# Patient Record
Sex: Female | Born: 1965 | Race: White | Hispanic: No | Smoking: Current every day smoker
Health system: Southern US, Community
[De-identification: ages and names within clinical notes are randomized; demographics above are authoritative.]

## PROBLEM LIST (undated history)

## (undated) DIAGNOSIS — J40 Bronchitis, not specified as acute or chronic: Secondary | ICD-10-CM

## (undated) DIAGNOSIS — N6009 Solitary cyst of unspecified breast: Secondary | ICD-10-CM

## (undated) DIAGNOSIS — J189 Pneumonia, unspecified organism: Secondary | ICD-10-CM

## (undated) DIAGNOSIS — H6690 Otitis media, unspecified, unspecified ear: Secondary | ICD-10-CM

## (undated) DIAGNOSIS — F419 Anxiety disorder, unspecified: Secondary | ICD-10-CM

## (undated) HISTORY — PX: TUBAL LIGATION: SHX77

## (undated) HISTORY — DX: Solitary cyst of unspecified breast: N60.09

---

## 2003-04-27 HISTORY — PX: BREAST SURGERY: SHX581

## 2006-05-03 ENCOUNTER — Encounter (HOSPITAL_COMMUNITY): Admission: RE | Admit: 2006-05-03 | Discharge: 2006-06-02 | Payer: Self-pay | Admitting: Preventative Medicine

## 2007-11-01 ENCOUNTER — Emergency Department (HOSPITAL_COMMUNITY): Admission: EM | Admit: 2007-11-01 | Discharge: 2007-11-02 | Payer: Self-pay | Admitting: Emergency Medicine

## 2009-08-05 ENCOUNTER — Ambulatory Visit (HOSPITAL_COMMUNITY): Admission: RE | Admit: 2009-08-05 | Discharge: 2009-08-05 | Payer: Self-pay | Admitting: Family Medicine

## 2010-03-23 ENCOUNTER — Emergency Department (HOSPITAL_COMMUNITY): Admission: EM | Admit: 2010-03-23 | Discharge: 2010-03-24 | Payer: Self-pay | Admitting: Emergency Medicine

## 2010-11-02 DIAGNOSIS — F17200 Nicotine dependence, unspecified, uncomplicated: Secondary | ICD-10-CM | POA: Insufficient documentation

## 2010-11-02 DIAGNOSIS — J189 Pneumonia, unspecified organism: Secondary | ICD-10-CM | POA: Insufficient documentation

## 2010-11-03 ENCOUNTER — Encounter: Payer: Self-pay | Admitting: *Deleted

## 2010-11-03 ENCOUNTER — Emergency Department (HOSPITAL_COMMUNITY)
Admission: EM | Admit: 2010-11-03 | Discharge: 2010-11-03 | Disposition: A | Discharge status: Home / Self Care | Payer: Self-pay | Attending: Emergency Medicine | Admitting: Emergency Medicine

## 2010-11-03 ENCOUNTER — Emergency Department (HOSPITAL_COMMUNITY): Payer: Self-pay

## 2010-11-03 DIAGNOSIS — J189 Pneumonia, unspecified organism: Secondary | ICD-10-CM

## 2010-11-03 MED ORDER — ONDANSETRON HCL 4 MG/2ML IJ SOLN
4.0000 mg | Freq: Once | INTRAMUSCULAR | Status: AC
  Administered 2010-11-03: 4 mg via INTRAVENOUS

## 2010-11-03 MED ORDER — DOXYCYCLINE HYCLATE 100 MG PO CAPS: 100.0000 mg | ORAL_CAPSULE | Freq: Two times a day (BID) | ORAL | Status: AC

## 2010-11-03 MED ORDER — DOXYCYCLINE HYCLATE 100 MG PO TABS
100.0000 mg | ORAL_TABLET | Freq: Once | ORAL | Status: AC
  Administered 2010-11-03: 100 mg via ORAL
  Filled 2010-11-03: qty 1

## 2010-11-03 MED ORDER — ONDANSETRON HCL 4 MG PO TABS
4.0000 mg | ORAL_TABLET | Freq: Once | ORAL | Status: AC
Start: 2010-11-03 — End: 2010-11-03
  Administered 2010-11-03: 4 mg via ORAL

## 2010-11-03 MED ORDER — HYDROMORPHONE HCL 1 MG/ML IJ SOLN
INTRAMUSCULAR | Status: AC
  Administered 2010-11-03: 04:00:00
  Filled 2010-11-03: qty 1

## 2010-11-03 MED ORDER — SODIUM CHLORIDE 0.9 % IV SOLN
Freq: Once | INTRAVENOUS | Status: AC
  Administered 2010-11-03 (×2): via INTRAVENOUS

## 2010-11-03 MED ORDER — HYDROCODONE-ACETAMINOPHEN 5-325 MG PO TABS: 1.0000 | ORAL_TABLET | ORAL | Status: AC | PRN

## 2010-11-03 MED ORDER — ONDANSETRON HCL 4 MG/2ML IJ SOLN
INTRAMUSCULAR | Status: AC
  Administered 2010-11-03: 05:00:00
  Filled 2010-11-03: qty 2

## 2010-11-03 MED ORDER — ONDANSETRON HCL 4 MG PO TABS
ORAL_TABLET | ORAL | Status: AC
  Administered 2010-11-03: 04:00:00
  Filled 2010-11-03: qty 1

## 2010-11-03 MED ORDER — HYDROMORPHONE HCL 1 MG/ML IJ SOLN
1.0000 mg | Freq: Once | INTRAMUSCULAR | Status: AC
Start: 2010-11-03 — End: 2010-11-03
  Administered 2010-11-03: 1 mg via INTRAVENOUS

## 2010-11-03 MED ORDER — KETOROLAC TROMETHAMINE 30 MG/ML IJ SOLN
INTRAMUSCULAR | Status: AC
  Administered 2010-11-03: 04:00:00
  Filled 2010-11-03: qty 1

## 2010-11-03 MED ORDER — KETOROLAC TROMETHAMINE 30 MG/ML IJ SOLN
30.0000 mg | Freq: Once | INTRAMUSCULAR | Status: AC
  Administered 2010-11-03: 30 mg via INTRAVENOUS

## 2010-11-03 NOTE — ED Notes (Signed)
Pt states HA, productive cough, sinus pressure. Body aches.

## 2010-11-03 NOTE — ED Notes (Signed)
Pt c/o productive cough for a week, now pain in chest and back with coughing,  Denies fevers

## 2010-11-03 NOTE — ED Notes (Signed)
Patient assisted in ambulating around nurse's station. Patient's gait steady, patient does report some dizziness. Dr Colon Branch made aware. Per Dr Colon Branch patient okay to be discharged home.

## 2010-11-03 NOTE — ED Notes (Signed)
Patient is resting comfortably. 

## 2010-11-03 NOTE — ED Provider Notes (Signed)
History     Chief Complaint  Patient presents with  . Cough  . Headache   HPI Comments: Patient has had a cough for over 2 weeks. Now cough is associated with chest discomfort. Sputum is yellow-green. Denies fever, chills.   Patient is a 45 y.o. female presenting with cough and headaches. The history is provided by the patient.  Cough This is a new problem. The current episode started more than 1 week ago. The cough is non-productive. Associated symptoms include chest pain, headaches and shortness of breath. She has tried decongestants for the symptoms. The treatment provided no relief.  Headache  Associated symptoms include shortness of breath.    History reviewed. No pertinent past medical history.  Past Surgical History  Procedure Date  . Cesarean section   . Tubal ligation     History reviewed. No pertinent family history.  History  Substance Use Topics  . Smoking status: Current Everyday Smoker -- 1.0 packs/day  . Smokeless tobacco: Never Used  . Alcohol Use: No    OB History    Grav Para Term Preterm Abortions TAB SAB Ect Mult Living                  Review of Systems  Respiratory: Positive for cough and shortness of breath.   Cardiovascular: Positive for chest pain.  Neurological: Positive for headaches.  All other systems reviewed and are negative.    Physical Exam  BP 84/48  Pulse 65  Temp(Src) 98.3 F (36.8 C) (Oral)  Resp 20  Ht 5\' 9"  (1.753 m)  Wt 136 lb (61.689 kg)  BMI 20.08 kg/m2  SpO2 96%  Physical Exam  Nursing note and vitals reviewed. Constitutional: She is oriented to person, place, and time. She appears well-developed and well-nourished.  HENT:  Head: Normocephalic and atraumatic.  Right Ear: External ear normal.  Left Ear: External ear normal.  Nose: Nose normal.  Mouth/Throat: Oropharynx is clear and moist.  Eyes: Conjunctivae and EOM are normal. Pupils are equal, round, and reactive to light.  Neck: Normal range of motion.  Neck supple.  Cardiovascular: Normal rate, regular rhythm and normal heart sounds.   Pulmonary/Chest: Effort normal and breath sounds normal. No respiratory distress.  Abdominal: Soft. Bowel sounds are normal.  Musculoskeletal: Normal range of motion.  Neurological: She is alert and oriented to person, place, and time.  Skin: Skin is warm and dry.    ED Course  Procedures  MDM       Nicoletta Dress. Colon Branch, MD 11/03/10 [phone]

## 2010-11-18 ENCOUNTER — Emergency Department (HOSPITAL_COMMUNITY)
Admission: EM | Admit: 2010-11-18 | Discharge: 2010-11-18 | Discharge status: Against Medical Advice | Payer: Self-pay | Attending: *Deleted | Admitting: *Deleted

## 2010-11-18 ENCOUNTER — Encounter (HOSPITAL_COMMUNITY): Payer: Self-pay | Admitting: *Deleted

## 2010-11-18 DIAGNOSIS — M549 Dorsalgia, unspecified: Secondary | ICD-10-CM | POA: Insufficient documentation

## 2010-11-18 DIAGNOSIS — Z5320 Procedure and treatment not carried out because of patient's decision for unspecified reasons: Secondary | ICD-10-CM | POA: Insufficient documentation

## 2010-11-18 HISTORY — DX: Pneumonia, unspecified organism: J18.9

## 2010-11-18 LAB — URINALYSIS, ROUTINE W REFLEX MICROSCOPIC
Bilirubin Urine: NEGATIVE
Ketones, ur: NEGATIVE mg/dL
Urobilinogen, UA: 0.2 mg/dL (ref 0.0–1.0)
pH: 5.5 (ref 5.0–8.0)

## 2010-11-18 LAB — URINE MICROSCOPIC-ADD ON

## 2010-11-18 NOTE — ED Notes (Signed)
Patient with recent dx of PNA here, went to [facility] 11/07/10 and started on more antibiotics, pt still c/o lower back pain and now frequent urination, burns slightly with urination

## 2011-01-21 LAB — CBC
HCT: 38.5
MCHC: 33.4
MCV: 90.8
Platelets: 212
RBC: 4.25
RDW: 13.7

## 2011-01-21 LAB — COMPREHENSIVE METABOLIC PANEL
BUN: 17
CO2: 29
Chloride: 108
GFR calc Af Amer: >60
Glucose, Bld: 100 — ABNORMAL HIGH
Potassium: 4.3
Sodium: 140

## 2011-01-21 LAB — DIFFERENTIAL
Basophils Absolute: 0
Eosinophils Relative: 2
Lymphs Abs: 4.3 — ABNORMAL HIGH
Monocytes Relative: 7
Neutro Abs: 6.3

## 2011-01-21 LAB — URINALYSIS, ROUTINE W REFLEX MICROSCOPIC
Protein, ur: NEGATIVE
Urobilinogen, UA: 0.2

## 2011-09-06 ENCOUNTER — Emergency Department (HOSPITAL_COMMUNITY): Payer: Self-pay

## 2011-09-06 ENCOUNTER — Emergency Department (HOSPITAL_COMMUNITY)
Admission: EM | Admit: 2011-09-06 | Discharge: 2011-09-06 | Disposition: A | Discharge status: Home / Self Care | Payer: Self-pay | Attending: Emergency Medicine | Admitting: Emergency Medicine

## 2011-09-06 ENCOUNTER — Encounter (HOSPITAL_COMMUNITY): Payer: Self-pay | Admitting: *Deleted

## 2011-09-06 DIAGNOSIS — R059 Cough, unspecified: Secondary | ICD-10-CM | POA: Insufficient documentation

## 2011-09-06 DIAGNOSIS — M545 Low back pain, unspecified: Secondary | ICD-10-CM | POA: Insufficient documentation

## 2011-09-06 DIAGNOSIS — J069 Acute upper respiratory infection, unspecified: Secondary | ICD-10-CM | POA: Insufficient documentation

## 2011-09-06 DIAGNOSIS — R070 Pain in throat: Secondary | ICD-10-CM | POA: Insufficient documentation

## 2011-09-06 DIAGNOSIS — J329 Chronic sinusitis, unspecified: Secondary | ICD-10-CM | POA: Insufficient documentation

## 2011-09-06 DIAGNOSIS — R05 Cough: Secondary | ICD-10-CM | POA: Insufficient documentation

## 2011-09-06 DIAGNOSIS — IMO0001 Reserved for inherently not codable concepts without codable children: Secondary | ICD-10-CM | POA: Insufficient documentation

## 2011-09-06 DIAGNOSIS — R509 Fever, unspecified: Secondary | ICD-10-CM | POA: Insufficient documentation

## 2011-09-06 DIAGNOSIS — R51 Headache: Secondary | ICD-10-CM | POA: Insufficient documentation

## 2011-09-06 DIAGNOSIS — J3489 Other specified disorders of nose and nasal sinuses: Secondary | ICD-10-CM | POA: Insufficient documentation

## 2011-09-06 MED ORDER — FEXOFENADINE-PSEUDOEPHED ER 60-120 MG PO TB12: 1.0000 | ORAL_TABLET | Freq: Two times a day (BID) | ORAL | Status: DC

## 2011-09-06 MED ORDER — PREDNISONE 10 MG PO TABS: ORAL_TABLET | ORAL | Status: DC

## 2011-09-06 MED ORDER — PROMETHAZINE-CODEINE 6.25-10 MG/5ML PO SYRP: 5.0000 mL | ORAL_SOLUTION | Freq: Four times a day (QID) | ORAL | Status: AC | PRN

## 2011-09-06 NOTE — ED Provider Notes (Signed)
History     CSN: [id number]  Arrival date & time 09/06/11  1152   First MD Initiated Contact with Patient 09/06/11 1308      Chief Complaint  Patient presents with  . Bronchitis    (Consider location/radiation/quality/duration/timing/severity/associated sxs/prior treatment) Patient is a 46 y.o. female presenting with cough. The history is provided by the patient.  Cough This is a recurrent problem. The problem occurs hourly. The problem has been gradually worsening. The cough is productive of sputum. The maximum temperature recorded prior to her arrival was 100 to 100.9 F. Associated symptoms include chills, headaches, rhinorrhea, sore throat and myalgias. Pertinent negatives include no chest pain, no shortness of breath and no wheezing. She has tried cough syrup for the symptoms. The treatment provided mild relief. She is a smoker. Her past medical history is significant for bronchitis and pneumonia.    Past Medical History  Diagnosis Date  . Pneumonia     Past Surgical History  Procedure Date  . Cesarean section   . Tubal ligation     History reviewed. No pertinent family history.  History  Substance Use Topics  . Smoking status: Current Everyday Smoker -- 0.5 packs/day    Types: Cigarettes  . Smokeless tobacco: Never Used  . Alcohol Use: No    OB History    Grav Para Term Preterm Abortions TAB SAB Ect Mult Living                  Review of Systems  Constitutional: Positive for chills. Negative for activity change.       All ROS Neg except as noted in HPI  HENT: Positive for sore throat and rhinorrhea. Negative for nosebleeds and neck pain.   Eyes: Negative for photophobia and discharge.  Respiratory: Positive for cough. Negative for shortness of breath and wheezing.   Cardiovascular: Negative for chest pain and palpitations.  Gastrointestinal: Negative for abdominal pain and blood in stool.  Genitourinary: Negative for dysuria, frequency and hematuria.    Musculoskeletal: Positive for myalgias. Negative for back pain and arthralgias.  Skin: Negative.   Neurological: Positive for headaches. Negative for dizziness, seizures and speech difficulty.  Psychiatric/Behavioral: Negative for hallucinations and confusion.    Allergies  Penicillins  Home Medications   Current Outpatient Rx  Name Route Sig Dispense Refill  . BENZONATATE 100 MG PO CAPS Oral Take 100 mg by mouth 3 (three) times daily as needed.      . CEPHALEXIN 500 MG PO CAPS Oral Take 500 mg by mouth 4 (four) times daily.      Marland Kitchen FEXOFENADINE-PSEUDOEPHED ER 60-120 MG PO TB12 Oral Take 1 tablet by mouth every 12 (twelve) hours. 20 tablet 0  . PREDNISONE 10 MG PO TABS  5,4,3,2,1 - take with food 15 tablet 0  . PROMETHAZINE-CODEINE 6.25-10 MG/5ML PO SYRP Oral Take 5 mLs by mouth every 6 (six) hours as needed for cough. 120 mL 0    BP 109/63  Pulse 83  Temp(Src) 97.4 F (36.3 C) (Oral)  Resp 20  Ht 5\' 7"  (1.702 m)  Wt 134 lb (60.782 kg)  BMI 20.99 kg/m2  SpO2 100%  LMP 08/22/2011  Physical Exam  Nursing note and vitals reviewed. Constitutional: She is oriented to person, place, and time. She appears well-developed and well-nourished.  Non-toxic appearance.  HENT:  Head: Normocephalic.  Right Ear: Tympanic membrane and external ear normal.  Left Ear: Tympanic membrane and external ear normal.       Nasal  congestion present.  Eyes: EOM and lids are normal. Pupils are equal, round, and reactive to light.  Neck: Normal range of motion. Neck supple. Carotid bruit is not present.  Cardiovascular: Normal rate, regular rhythm, normal heart sounds, intact distal pulses and normal pulses.   Pulmonary/Chest: Breath sounds normal. No stridor. No respiratory distress.  Abdominal: Soft. Bowel sounds are normal. There is no tenderness. There is no guarding.  Musculoskeletal: Normal range of motion.       Soreness of the mid and lower back with change of position .  Lymphadenopathy:        Head (right side): No submandibular adenopathy present.       Head (left side): No submandibular adenopathy present.    She has no cervical adenopathy.  Neurological: She is alert and oriented to person, place, and time. She has normal strength. No cranial nerve deficit or sensory deficit.  Skin: Skin is warm and dry.  Psychiatric: She has a normal mood and affect. Her speech is normal.    ED Course  Procedures (including critical care time)  Labs Reviewed - No data to display Dg Chest 2 View  09/06/2011  *RADIOLOGY REPORT*  Clinical Data: Cough, congestion, bronchitis, smoker  CHEST - 2 VIEW  Comparison: 11/03/2010  Findings: Normal heart size, mediastinal contours, and pulmonary vascularity. Lungs appear emphysematous without infiltrate or pleural effusion. No pneumothorax. No acute osseous findings.  IMPRESSION: Emphysematous lungs without acute infiltrate.  Original Report Authenticated By: Lollie Marrow, M.D.     1. Sinusitis   2. URI (upper respiratory infection)       MDM  I have reviewed nursing notes, vital signs, and all appropriate lab and imaging results for this patient. Chest xray negative for pneumonia or acute problem. O2 sat 100%. Rx for allegra D, prednisone, and Promethazine cough med #141ml given to the patient. Pt finished a Z pack last week. Pt to see her primary MD or return  To the ED if any acute changes.       Kathie Dike, [place] 09/06/11 1332

## 2011-09-06 NOTE — ED Notes (Signed)
Pt seen at [facility] on May 5 and dx with bronchitis and prescribed an antibiotic ( z-pack), pt states that she feels worse than she did at the time seen at [facility], states she has been sick since April, states hx of PNA in July 2012

## 2011-09-06 NOTE — Discharge Instructions (Signed)
Sinusitis, Child Sinusitis commonly results from a blockage of the openings that drain your child's sinuses. Sinuses are air pockets within the bones of the face. This blockage prevents the pockets from draining. The multiplication of bacteria within a sinus leads to infection. SYMPTOMS  Pain depends on what area is infected. Infection below your child's eyes causes pain below your child's eyes.  Other symptoms:  Toothaches.   Colored, thick discharge from the nose.   Swelling.   Warmth.   Tenderness.  HOME CARE INSTRUCTIONS  Your child's caregiver has prescribed antibiotics. Give your child the medicine as directed. Give your child the medicine for the entire length of time for which it was prescribed. Continue to give the medicine as prescribed even if your child appears to be doing well. You may also have been given a decongestant. This medication will aid in draining the sinuses. Administer the medicine as directed by your doctor or pharmacist.  Only take over-the-counter or prescription medicines for pain, discomfort, or fever as directed by your caregiver. Should your child develop other problems not relieved by their medications, see yourprimary doctor or visit the Emergency Department. SEEK IMMEDIATE MEDICAL CARE IF:   Your child has an oral temperature above 102 F (38.9 C), not controlled by medicine.   The fever is not gone 48 hours after your child starts taking the antibiotic.   Your child develops increasing pain, a severe headache, a stiff neck, or a toothache.   Your child develops vomiting or drowsiness.   Your child develops unusual swelling over any area of the face or has trouble seeing.   The area around either eye becomes red.   Your child develops double vision, or complains of any problem with vision.  Document Released: 08/22/2006 Document Revised: 04/01/2011 Document Reviewed: 03/28/2007 [facility] Patient Information 2012 [place], [place].Upper Respiratory  Infection, Adult An upper respiratory infection (URI) is also sometimes known as the common cold. The upper respiratory tract includes the nose, sinuses, throat, trachea, and bronchi. Bronchi are the airways leading to the lungs. Most people improve within 1 week, but symptoms can last up to 2 weeks. A residual cough may last even longer.  CAUSES Many different viruses can infect the tissues lining the upper respiratory tract. The tissues become irritated and inflamed and often become very moist. Mucus production is also common. A cold is contagious. You can easily spread the virus to others by oral contact. This includes kissing, sharing a glass, coughing, or sneezing. Touching your mouth or nose and then touching a surface, which is then touched by another person, can also spread the virus. SYMPTOMS  Symptoms typically develop 1 to 3 days after you come in contact with a cold virus. Symptoms vary from person to person. They may include:  Runny nose.   Sneezing.   Nasal congestion.   Sinus irritation.   Sore throat.   Loss of voice (laryngitis).   Cough.   Fatigue.   Muscle aches.   Loss of appetite.   Headache.   Low-grade fever.  DIAGNOSIS  You might diagnose your own cold based on familiar symptoms, since most people get a cold 2 to 3 times a year. Your caregiver can confirm this based on your exam. Most importantly, your caregiver can check that your symptoms are not due to another disease such as strep throat, sinusitis, pneumonia, asthma, or epiglottitis. Blood tests, throat tests, and X-rays are not necessary to diagnose a common cold, but they may sometimes be helpful  in excluding other more serious diseases. Your caregiver will decide if any further tests are required. RISKS AND COMPLICATIONS  You may be at risk for a more severe case of the common cold if you smoke cigarettes, have chronic heart disease (such as heart failure) or lung disease (such as asthma), or if you  have a weakened immune system. The very young and very old are also at risk for more serious infections. Bacterial sinusitis, middle ear infections, and bacterial pneumonia can complicate the common cold. The common cold can worsen asthma and chronic obstructive pulmonary disease (COPD). Sometimes, these complications can require emergency medical care and may be life-threatening. PREVENTION  The best way to protect against getting a cold is to practice good hygiene. Avoid oral or hand contact with people with cold symptoms. Wash your hands often if contact occurs. There is no clear evidence that vitamin C, vitamin E, echinacea, or exercise reduces the chance of developing a cold. However, it is always recommended to get plenty of rest and practice good nutrition. TREATMENT  Treatment is directed at relieving symptoms. There is no cure. Antibiotics are not effective, because the infection is caused by a virus, not by bacteria. Treatment may include:  Increased fluid intake. Sports drinks offer valuable electrolytes, sugars, and fluids.   Breathing heated mist or steam (vaporizer or shower).   Eating chicken soup or other clear broths, and maintaining good nutrition.   Getting plenty of rest.   Using gargles or lozenges for comfort.   Controlling fevers with ibuprofen or acetaminophen as directed by your caregiver.   Increasing usage of your inhaler if you have asthma.  Zinc gel and zinc lozenges, taken in the first 24 hours of the common cold, can shorten the duration and lessen the severity of symptoms. Pain medicines may help with fever, muscle aches, and throat pain. A variety of non-prescription medicines are available to treat congestion and runny nose. Your caregiver can make recommendations and may suggest nasal or lung inhalers for other symptoms.  HOME CARE INSTRUCTIONS   Only take over-the-counter or prescription medicines for pain, discomfort, or fever as directed by your caregiver.     Use a warm mist humidifier or inhale steam from a shower to increase air moisture. This may keep secretions moist and make it easier to breathe.   Drink enough water and fluids to keep your urine clear or pale yellow.   Rest as needed.   Return to work when your temperature has returned to normal or as your caregiver advises. You may need to stay home longer to avoid infecting others. You can also use a face mask and careful hand washing to prevent spread of the virus.  SEEK MEDICAL CARE IF:   After the first few days, you feel you are getting worse rather than better.   You need your caregiver's advice about medicines to control symptoms.   You develop chills, worsening shortness of breath, or brown or red sputum. These may be signs of pneumonia.   You develop yellow or brown nasal discharge or pain in the face, especially when you bend forward. These may be signs of sinusitis.   You develop a fever, swollen neck glands, pain with swallowing, or white areas in the back of your throat. These may be signs of strep throat.  SEEK IMMEDIATE MEDICAL CARE IF:   You have a fever.   You develop severe or persistent headache, ear pain, sinus pain, or chest pain.   You  develop wheezing, a prolonged cough, cough up blood, or have a change in your usual mucus (if you have chronic lung disease).   You develop sore muscles or a stiff neck.  Document Released: 10/06/2000 Document Revised: 04/01/2011 Document Reviewed: 08/14/2010 [facility] Patient Information 2012 [place], [place].

## 2011-09-06 NOTE — ED Notes (Signed)
Cough, headache, no fever,  Seen at [facility] and treated with Z pack  And Hydrocodne syrup, Feels no better

## 2011-09-07 NOTE — ED Provider Notes (Signed)
Medical screening examination/treatment/procedure(s) were performed by non-physician practitioner and as supervising physician I was immediately available for consultation/collaboration.   Yassmine Tamm M Tiegan Terpstra, DO 09/07/11 0711 

## 2012-05-22 ENCOUNTER — Emergency Department (HOSPITAL_COMMUNITY)
Admission: EM | Admit: 2012-05-22 | Discharge: 2012-05-23 | Disposition: A | Discharge status: Home / Self Care | Payer: Self-pay | Attending: Emergency Medicine | Admitting: Emergency Medicine

## 2012-05-22 ENCOUNTER — Emergency Department (HOSPITAL_COMMUNITY): Payer: Self-pay

## 2012-05-22 ENCOUNTER — Encounter (HOSPITAL_COMMUNITY): Payer: Self-pay | Admitting: *Deleted

## 2012-05-22 DIAGNOSIS — B349 Viral infection, unspecified: Secondary | ICD-10-CM

## 2012-05-22 DIAGNOSIS — B9789 Other viral agents as the cause of diseases classified elsewhere: Secondary | ICD-10-CM | POA: Insufficient documentation

## 2012-05-22 DIAGNOSIS — Z8701 Personal history of pneumonia (recurrent): Secondary | ICD-10-CM | POA: Insufficient documentation

## 2012-05-22 DIAGNOSIS — F17200 Nicotine dependence, unspecified, uncomplicated: Secondary | ICD-10-CM | POA: Insufficient documentation

## 2012-05-22 MED ORDER — HYDROCOD POLST-CHLORPHEN POLST 10-8 MG/5ML PO LQCR
5.0000 mL | Freq: Once | ORAL | Status: AC
  Administered 2012-05-22: 5 mL via ORAL
  Filled 2012-05-22: qty 5

## 2012-05-22 MED ORDER — KETOROLAC TROMETHAMINE 60 MG/2ML IM SOLN
60.0000 mg | Freq: Once | INTRAMUSCULAR | Status: AC
  Administered 2012-05-22: 60 mg via INTRAMUSCULAR
  Filled 2012-05-22: qty 2

## 2012-05-22 NOTE — ED Notes (Signed)
Pt reporting generalized body aches, fatigue, chills and cough for 2 days.  Reports symptoms worse today. Denies nausea or vomiting.

## 2012-05-22 NOTE — ED Notes (Signed)
Iva Lento, PA-C in to assess.

## 2012-05-22 NOTE — ED Provider Notes (Signed)
History     CSN: [id number]  Arrival date & time 05/22/12  2159   First MD Initiated Contact with Patient 05/22/12 2248      No chief complaint on file.   (Consider location/radiation/quality/duration/timing/severity/associated sxs/prior treatment) Patient is a 47 y.o. female presenting with URI. The history is provided by the patient.  URI The primary symptoms include fatigue, headaches, sore throat, cough and myalgias. Primary symptoms do not include fever, ear pain, swollen glands, wheezing, abdominal pain, nausea, vomiting, arthralgias or rash. Primary symptoms comment: chills The current episode started 2 days ago. This is a new problem. The problem has been gradually worsening.  The fatigue began 2 days ago. The fatigue has been unchanged since its onset. The fatigue is worsened by nothing.  The headache began 2 days ago. The headache developed gradually. The headache is present intermittently. The headache is not associated with aura, photophobia, double vision, eye pain, decreased vision, stiff neck, neck stiffness, paresthesias, weakness or loss of balance.  The sore throat began 2 days ago. The sore throat has been unchanged since its onset. The sore throat is mild in intensity. The sore throat is not accompanied by trouble swallowing or stridor.  The cough began 2 days ago. The cough is new. The cough is non-productive.  Myalgias began 2 days ago. The myalgias have been unchanged since their onset. The myalgias are generalized. The myalgias are aching. The discomfort from the myalgias is moderate. The myalgias are not associated with weakness.  Symptoms associated with the illness include chills, congestion and rhinorrhea. The illness is not associated with facial pain or sinus pressure.    Past Medical History  Diagnosis Date  . Pneumonia     Past Surgical History  Procedure Date  . Cesarean section   . Tubal ligation     History reviewed. No pertinent family  history.  History  Substance Use Topics  . Smoking status: Current Every Day Smoker -- 0.5 packs/day    Types: Cigarettes  . Smokeless tobacco: Never Used  . Alcohol Use: No    OB History    Grav Para Term Preterm Abortions TAB SAB Ect Mult Living                  Review of Systems  Constitutional: Positive for chills and fatigue. Negative for fever and appetite change.  HENT: Positive for congestion, sore throat and rhinorrhea. Negative for ear pain, facial swelling, trouble swallowing, neck pain, neck stiffness and sinus pressure.   Eyes: Negative for double vision, photophobia and pain.  Respiratory: Positive for cough. Negative for chest tightness, shortness of breath, wheezing and stridor.   Cardiovascular: Negative for chest pain and palpitations.  Gastrointestinal: Negative for nausea, vomiting and abdominal pain.  Genitourinary: Negative for frequency, flank pain, vaginal bleeding, vaginal discharge and difficulty urinating.  Musculoskeletal: Positive for myalgias. Negative for arthralgias.  Skin: Negative for rash.  Neurological: Positive for headaches. Negative for dizziness, syncope, weakness, light-headedness, numbness, paresthesias and loss of balance.  Psychiatric/Behavioral: Negative for confusion.  All other systems reviewed and are negative.    Allergies  Penicillins  Home Medications  No current outpatient prescriptions on file.  BP 112/56  Pulse 98  Temp 98.4 F (36.9 C) (Oral)  Resp 18  Ht 5\' 9"  (1.753 m)  Wt 135 lb (61.236 kg)  BMI 19.94 kg/m2  SpO2 100%  LMP 05/02/2012  Physical Exam  Nursing note and vitals reviewed. Constitutional: She is oriented to person, place, and  time. She appears well-developed and well-nourished. No distress.  HENT:  Head: Normocephalic and atraumatic.  Right Ear: Tympanic membrane and ear canal normal.  Left Ear: Tympanic membrane and ear canal normal.  Nose: Nose normal.  Mouth/Throat: Uvula is midline and  mucous membranes are normal. Posterior oropharyngeal erythema present. No oropharyngeal exudate or posterior oropharyngeal edema.  Eyes: Conjunctivae normal and EOM are normal. Pupils are equal, round, and reactive to light.  Neck: Normal range of motion. Neck supple.  Cardiovascular: Normal rate, regular rhythm, normal heart sounds and intact distal pulses.   No murmur heard. Pulmonary/Chest: Effort normal. No respiratory distress. She has no wheezes. She has no rales. She exhibits no tenderness.       Lung sounds are coarse bilaterally.  No rales or wheezing  Abdominal: Soft. She exhibits no distension. There is no tenderness. There is no rebound and no guarding.  Musculoskeletal: Normal range of motion.  Lymphadenopathy:    She has no cervical adenopathy.  Neurological: She is alert and oriented to person, place, and time. She exhibits normal muscle tone. Coordination normal.  Skin: Skin is warm and dry.    ED Course  Procedures (including critical care time)  Labs Reviewed - No data to display No results found.  Dg Chest 2 View  05/23/2012  *RADIOLOGY REPORT*  Clinical Data: Fever, cough and chills.  CHEST - 2 VIEW  Comparison: 02/18/2012 and prior chest radiographs  Findings: The cardiomediastinal silhouette is unremarkable. 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 A nodular opacity overlying the lower left lung probably represents nipple shadow. There is no evidence of focal airspace disease, pulmonary edema, mass, pleural effusion, or pneumothorax. No acute bony abnormalities are identified.  IMPRESSION: COPD/emphysema without evidence of acute cardiopulmonary disease.  Nodular opacity overlying the lower left lung - likely nipple shadow.  Recommend frontal chest x-ray with nipple markers.   Original Report Authenticated By: Harmon Pier, M.D.    Dg Chest Special View  05/23/2012  *RADIOLOGY REPORT*  Clinical Data: Repeat radiograph with nipple markers as per radiologist request.  CHEST SPECIAL  VIEW  Comparison: 05/22/2012  Findings: Hyperinflation.  Mild interstitial coarsening. The previously described nodule projecting over the left lower lobe corresponds to the level of the nipple marker on the repeat radiograph.  Cardiomediastinal contours within normal limits.  No acute osseous finding.  IMPRESSION: Nodular opacity on prior corresponds to the level of the nipple marker on repeat radiograph.   Original Report Authenticated By: Jearld Lesch, M.D.      MDM   Patient is feeling better after IM toradol and po tussionex.  Vitals stable, sx's likely related to viral illness.    Pt agrees to rest, fluids, and close f/u with her PMD if needed  Prescribed: tussionex 60 mL ibuprofen     Benjamyn Hestand L. Yvette Roark, [place] 05/23/12 0140

## 2012-05-23 ENCOUNTER — Emergency Department (HOSPITAL_COMMUNITY): Payer: Self-pay

## 2012-05-23 MED ORDER — HYDROCOD POLST-CHLORPHEN POLST 10-8 MG/5ML PO LQCR: 5.0000 mL | Freq: Two times a day (BID) | ORAL | Status: DC | PRN

## 2012-05-23 MED ORDER — IBUPROFEN 800 MG PO TABS: 800.0000 mg | ORAL_TABLET | Freq: Three times a day (TID) | ORAL | Status: DC

## 2012-05-23 NOTE — ED Provider Notes (Signed)
Medical screening examination/treatment/procedure(s) were performed by non-physician practitioner and as supervising physician I was immediately available for consultation/collaboration.   Shelda Jakes, MD 05/23/12 1154

## 2012-05-23 NOTE — ED Notes (Signed)
Discharge instructions given and reviewed with patient.  Prescriptions given for Tussionex and Ibuprofen; effects and use explained.  Patient verbalized understanding of sedating effects of Tussionex.  Patient ambulatory; discharged home in good condition.

## 2012-05-23 NOTE — ED Notes (Signed)
Patient transported back to radiology for additional chest film.

## 2012-07-27 ENCOUNTER — Other Ambulatory Visit (HOSPITAL_COMMUNITY): Payer: Self-pay | Admitting: Nurse Practitioner

## 2012-07-27 DIAGNOSIS — Z1231 Encounter for screening mammogram for malignant neoplasm of breast: Secondary | ICD-10-CM

## 2012-08-07 ENCOUNTER — Ambulatory Visit (HOSPITAL_COMMUNITY): Payer: Self-pay

## 2012-08-21 ENCOUNTER — Ambulatory Visit (HOSPITAL_COMMUNITY)
Admission: RE | Admit: 2012-08-21 | Discharge: 2012-08-21 | Disposition: A | Discharge status: Home / Self Care | Payer: Self-pay | Source: Ambulatory Visit | Attending: Family Medicine | Admitting: Family Medicine

## 2012-08-21 DIAGNOSIS — Z1231 Encounter for screening mammogram for malignant neoplasm of breast: Secondary | ICD-10-CM

## 2012-08-22 ENCOUNTER — Encounter (HOSPITAL_COMMUNITY): Payer: Self-pay | Admitting: Emergency Medicine

## 2012-08-22 ENCOUNTER — Emergency Department (HOSPITAL_COMMUNITY)
Admission: EM | Admit: 2012-08-22 | Discharge: 2012-08-22 | Disposition: A | Discharge status: Home / Self Care | Payer: Self-pay | Attending: Emergency Medicine | Admitting: Emergency Medicine

## 2012-08-22 DIAGNOSIS — Z8701 Personal history of pneumonia (recurrent): Secondary | ICD-10-CM | POA: Insufficient documentation

## 2012-08-22 DIAGNOSIS — R21 Rash and other nonspecific skin eruption: Secondary | ICD-10-CM | POA: Insufficient documentation

## 2012-08-22 DIAGNOSIS — Z880 Allergy status to penicillin: Secondary | ICD-10-CM | POA: Insufficient documentation

## 2012-08-22 DIAGNOSIS — F17200 Nicotine dependence, unspecified, uncomplicated: Secondary | ICD-10-CM | POA: Insufficient documentation

## 2012-08-22 MED ORDER — PREDNISONE (PAK) 10 MG PO TABS: 10.0000 mg | ORAL_TABLET | Freq: Every day | ORAL | Status: DC

## 2012-08-22 MED ORDER — DIPHENHYDRAMINE HCL 25 MG PO TABS: 25.0000 mg | ORAL_TABLET | Freq: Four times a day (QID) | ORAL | Status: DC

## 2012-08-22 NOTE — ED Notes (Signed)
Pt c/o rash to trunk of body x one day.

## 2012-08-22 NOTE — ED Notes (Signed)
Pt presents with an irritating/itchy rash that began today after using a tanning bed. Rash is noted to truck of body only with red raised areas. No weeping of rash noted.  Pt denies starting new medication. NAD noted. Denies SOB.

## 2012-08-22 NOTE — ED Provider Notes (Signed)
History     CSN: [id number]  Arrival date & time 08/22/12  1945   First MD Initiated Contact with Patient 08/22/12 2003      Chief Complaint  Patient presents with  . Rash    (Consider location/radiation/quality/duration/timing/severity/associated sxs/prior treatment) Patient is a 47 y.o. female presenting with rash. The history is provided by the patient.  Rash Location:  Torso Torso rash location:  Abd LUQ, abd RUQ, abd LLQ, abd RLQ, upper back and lower back Quality: itchiness and redness   Severity:  Moderate Onset quality:  Sudden Duration:  4 hours Timing:  Constant Progression:  Unchanged Chronicity:  New Relieved by:  None tried Worsened by:  Heat Ineffective treatments:  None tried Associated symptoms: no abdominal pain, no fever, no headaches, no nausea, no shortness of breath and not vomiting    KUMARI SCULLEY is a 47 y.o. female who presents to the ED with a rash. The rash started today after she got out of the tanning bed. She states that she used a new bronzing cream prior to tanning but the rash is only on her abdomen and back. She has not had any other exposures to anything new.    Past Medical History  Diagnosis Date  . Pneumonia     Past Surgical History  Procedure Laterality Date  . Cesarean section    . Tubal ligation      History reviewed. No pertinent family history.  History  Substance Use Topics  . Smoking status: Current Every Day Smoker -- 0.50 packs/day    Types: Cigarettes  . Smokeless tobacco: Never Used  . Alcohol Use: No    OB History   Grav Para Term Preterm Abortions TAB SAB Ect Mult Living                  Review of Systems  Constitutional: Negative for fever and chills.  Respiratory: Negative for cough, chest tightness and shortness of breath.   Gastrointestinal: Negative for nausea, vomiting and abdominal pain.  Musculoskeletal: Negative for back pain.  Skin: Positive for rash.  Neurological: Negative for dizziness  and headaches.  Psychiatric/Behavioral: The patient is not nervous/anxious.     Allergies  Penicillins and Sulfa antibiotics  Home Medications   Current Outpatient Rx  Name  Route  Sig  Dispense  Refill  . chlorpheniramine-HYDROcodone (TUSSIONEX PENNKINETIC ER) 10-8 MG/5ML LQCR   Oral   Take 5 mLs by mouth every 12 (twelve) hours as needed.   60 mL   0   . ibuprofen (ADVIL,MOTRIN) 800 MG tablet   Oral   Take 1 tablet (800 mg total) by mouth 3 (three) times daily. Take with food   21 tablet   0     BP 116/65  Pulse 90  Temp(Src) 97.1 F (36.2 C) (Oral)  Resp 17  Ht 5\' 9"  (1.753 m)  Wt 135 lb (61.236 kg)  BMI 19.93 kg/m2  SpO2 97%  LMP 08/21/2012  Physical Exam  Nursing note and vitals reviewed. Constitutional: She is oriented to person, place, and time. She appears well-developed and well-nourished. No distress.  HENT:  Head: Normocephalic and atraumatic.  Eyes: EOM are normal.  Neck: Neck supple.  Cardiovascular: Normal rate, regular rhythm and normal heart sounds.   Pulmonary/Chest: Effort normal and breath sounds normal. She has no wheezes.  Abdominal: Soft. Bowel sounds are normal. There is no tenderness.  Musculoskeletal: Normal range of motion.  Neurological: She is alert and oriented to person, place,  and time. No cranial nerve deficit.  Skin: Skin is warm and dry.  Papular, erythematous rash on abdomen worse than back.   Psychiatric: She has a normal mood and affect. Her behavior is normal. Judgment and thought content normal.    ED Course  Procedures (including critical care time)  Assessment: 47 y.o. female with rash and itching after using tanning bed   Possible allergic reaction to new tanning cream    Plan:  Prednisone, benadryl, follow up with dermatologist prn  MDM  I have reviewed this patient's vital signs, nurses notes and discussed clinical findings and plan of care with the patient. She voices understanding.   Medication List     TAKE these medications       diphenhydrAMINE 25 MG tablet  Commonly known as:  BENADRYL  Take 1 tablet (25 mg total) by mouth every 6 (six) hours.     predniSONE 10 MG tablet  Commonly known as:  STERAPRED UNI-PAK  Take 1 tablet (10 mg total) by mouth daily. Take 6 tablets today then 5, 4, 3, 2, 1      ASK your doctor about these medications       chlorpheniramine-HYDROcodone 10-8 MG/5ML Lqcr  Commonly known as:  TUSSIONEX PENNKINETIC ER  Take 5 mLs by mouth every 12 (twelve) hours as needed.     ibuprofen 800 MG tablet  Commonly known as:  ADVIL,MOTRIN  Take 1 tablet (800 mg total) by mouth 3 (three) times daily. Take with food               Janne Napoleon, NP 08/22/12 2034

## 2012-08-22 NOTE — ED Provider Notes (Signed)
Medical screening examination/treatment/procedure(s) were performed by non-physician practitioner and as supervising physician I was immediately available for consultation/collaboration.   Loren Racer, MD 08/22/12 [phone]

## 2012-08-28 ENCOUNTER — Other Ambulatory Visit: Payer: Self-pay | Admitting: Family Medicine

## 2012-08-28 DIAGNOSIS — R928 Other abnormal and inconclusive findings on diagnostic imaging of breast: Secondary | ICD-10-CM

## 2012-09-13 ENCOUNTER — Ambulatory Visit (HOSPITAL_COMMUNITY): Payer: Self-pay | Attending: Family Medicine

## 2012-09-13 ENCOUNTER — Encounter (HOSPITAL_COMMUNITY): Payer: Self-pay

## 2013-06-24 DIAGNOSIS — H6690 Otitis media, unspecified, unspecified ear: Secondary | ICD-10-CM

## 2013-06-24 DIAGNOSIS — J40 Bronchitis, not specified as acute or chronic: Secondary | ICD-10-CM

## 2013-06-24 HISTORY — DX: Bronchitis, not specified as acute or chronic: J40

## 2013-06-24 HISTORY — DX: Otitis media, unspecified, unspecified ear: H66.90

## 2013-07-11 ENCOUNTER — Emergency Department (HOSPITAL_COMMUNITY)
Admission: EM | Admit: 2013-07-11 | Discharge: 2013-07-11 | Disposition: A | Discharge status: Home / Self Care | Payer: Self-pay | Attending: Emergency Medicine | Admitting: Emergency Medicine

## 2013-07-11 ENCOUNTER — Encounter (HOSPITAL_COMMUNITY): Payer: Self-pay | Admitting: Emergency Medicine

## 2013-07-11 DIAGNOSIS — J069 Acute upper respiratory infection, unspecified: Secondary | ICD-10-CM | POA: Insufficient documentation

## 2013-07-11 DIAGNOSIS — IMO0002 Reserved for concepts with insufficient information to code with codable children: Secondary | ICD-10-CM | POA: Insufficient documentation

## 2013-07-11 DIAGNOSIS — Z880 Allergy status to penicillin: Secondary | ICD-10-CM | POA: Insufficient documentation

## 2013-07-11 DIAGNOSIS — J40 Bronchitis, not specified as acute or chronic: Secondary | ICD-10-CM

## 2013-07-11 DIAGNOSIS — F17200 Nicotine dependence, unspecified, uncomplicated: Secondary | ICD-10-CM | POA: Insufficient documentation

## 2013-07-11 DIAGNOSIS — Z8701 Personal history of pneumonia (recurrent): Secondary | ICD-10-CM | POA: Insufficient documentation

## 2013-07-11 DIAGNOSIS — J209 Acute bronchitis, unspecified: Secondary | ICD-10-CM | POA: Insufficient documentation

## 2013-07-11 DIAGNOSIS — Z791 Long term (current) use of non-steroidal anti-inflammatories (NSAID): Secondary | ICD-10-CM | POA: Insufficient documentation

## 2013-07-11 MED ORDER — AZITHROMYCIN 250 MG PO TABS: ORAL_TABLET | ORAL | Status: DC

## 2013-07-11 MED ORDER — HYDROCOD POLST-CHLORPHEN POLST 10-8 MG/5ML PO LQCR
5.0000 mL | Freq: Once | ORAL | Status: AC
  Administered 2013-07-11: 5 mL via ORAL
  Filled 2013-07-11: qty 5

## 2013-07-11 MED ORDER — AZITHROMYCIN 250 MG PO TABS
500.0000 mg | ORAL_TABLET | Freq: Once | ORAL | Status: AC
  Administered 2013-07-11: 500 mg via ORAL
  Filled 2013-07-11: qty 2

## 2013-07-11 MED ORDER — GUAIFENESIN-CODEINE 100-10 MG/5ML PO SYRP: 10.0000 mL | ORAL_SOLUTION | Freq: Three times a day (TID) | ORAL | Status: DC | PRN

## 2013-07-11 NOTE — Discharge Instructions (Signed)
Bronchitis °Bronchitis is swelling (inflammation) of the air tubes leading to your lungs (bronchi). This causes mucus and a cough. If the swelling gets bad, you may have trouble breathing. °HOME CARE  °· Rest. °· Drink enough fluids to keep your pee (urine) clear or pale yellow (unless you have a condition where you have to watch how much you drink). °· Only take medicine as told by your doctor. If you were given antibiotic medicines, finish them even if you start to feel better. °· Avoid smoke, irritating chemicals, and strong smells. These make the problem worse. Quit smoking if you smoke. This helps your lungs heal faster. °· Use a cool mist humidifier. Change the water in the humidifier every day. You can also sit in the bathroom with hot shower running for 5 10 minutes. Keep the door closed. °· See your health care provider as told. °· Wash your hands often. °GET HELP IF: °Your problems do not get better after 1 week. °GET HELP RIGHT AWAY IF:  °· Your fever gets worse. °· You have chills. °· Your chest hurts. °· Your problems breathing get worse. °· You have blood in your mucus. °· You pass out (faint). °· You feel lightheaded. °· You have a bad headache. °· You throw up (vomit) again and again. °MAKE SURE YOU: °· Understand these instructions. °· Will watch your condition. °· Will get help right away if you are not doing well or get worse. °Document Released: 09/29/2007 Document Revised: 01/31/2013 Document Reviewed: 12/05/2012 °[employer]® Patient Information ©2014 [employer]. ° °

## 2013-07-11 NOTE — ED Notes (Signed)
Patient c/o congestion, cough and generalized body aches.

## 2013-07-13 NOTE — ED Provider Notes (Signed)
CSN: [id number]     Arrival date & time 07/11/13  1825 History   First MD Initiated Contact with Patient 07/11/13 1932     Chief Complaint  Patient presents with  . Nasal Congestion     (Consider location/radiation/quality/duration/timing/severity/associated sxs/prior Treatment) Patient is a 48 y.o. female presenting with URI. The history is provided by the patient.  URI Presenting symptoms: congestion, cough and rhinorrhea   Presenting symptoms: no fatigue, no fever and no sore throat   Congestion:    Location:  Nasal and chest   Interferes with sleep: no     Interferes with eating/drinking: no   Cough:    Cough characteristics:  Non-productive   Severity:  Mild   Onset quality:  Gradual   Duration:  3 days   Timing:  Constant   Progression:  Unchanged   Chronicity:  New Severity:  Moderate Onset quality:  Gradual Timing:  Constant Progression:  Worsening Chronicity:  New Relieved by:  Nothing Worsened by:  Nothing tried Ineffective treatments:  OTC medications Associated symptoms: myalgias, sinus pain and sneezing   Associated symptoms: no arthralgias, no headaches, no neck pain, no swollen glands and no wheezing   Myalgias:    Location:  Generalized   Quality:  Aching Risk factors: not elderly, no diabetes mellitus and no immunosuppression     Past Medical History  Diagnosis Date  . Pneumonia    Past Surgical History  Procedure Laterality Date  . Cesarean section    . Tubal ligation     No family history on file. History  Substance Use Topics  . Smoking status: Current Every Day Smoker -- 0.50 packs/day    Types: Cigarettes  . Smokeless tobacco: Never Used  . Alcohol Use: No   OB History   Grav Para Term Preterm Abortions TAB SAB Ect Mult Living                 Review of Systems  Constitutional: Negative for fever, chills, activity change, appetite change and fatigue.  HENT: Positive for congestion, rhinorrhea, sinus pressure and sneezing. Negative  for facial swelling, sore throat and trouble swallowing.   Eyes: Negative for visual disturbance.  Respiratory: Positive for cough. Negative for shortness of breath, wheezing and stridor.   Gastrointestinal: Negative for nausea and vomiting.  Genitourinary: Negative for dysuria and frequency.  Musculoskeletal: Positive for myalgias. Negative for arthralgias, gait problem, neck pain and neck stiffness.  Skin: Negative.   Neurological: Negative for dizziness, weakness, numbness and headaches.  Hematological: Negative for adenopathy.  Psychiatric/Behavioral: Negative for confusion.  All other systems reviewed and are negative.      Allergies  Penicillins and Sulfa antibiotics  Home Medications   Current Outpatient Rx  Name  Route  Sig  Dispense  Refill  . azithromycin (ZITHROMAX Z-PAK) 250 MG tablet      Take two tablets on day one, then one tab qd days 2-5   6 tablet   0   . chlorpheniramine-HYDROcodone (TUSSIONEX PENNKINETIC ER) 10-8 MG/5ML LQCR   Oral   Take 5 mLs by mouth every 12 (twelve) hours as needed.   60 mL   0   . diphenhydrAMINE (BENADRYL) 25 MG tablet   Oral   Take 1 tablet (25 mg total) by mouth every 6 (six) hours.   20 tablet   0   . guaiFENesin-codeine (ROBITUSSIN AC) 100-10 MG/5ML syrup   Oral   Take 10 mLs by mouth 3 (three) times daily as needed  for cough.   120 mL   0   . ibuprofen (ADVIL,MOTRIN) 800 MG tablet   Oral   Take 1 tablet (800 mg total) by mouth 3 (three) times daily. Take with food   21 tablet   0   . predniSONE (STERAPRED UNI-PAK) 10 MG tablet   Oral   Take 1 tablet (10 mg total) by mouth daily. Take 6 tablets today then 5, 4, 3, 2, 1   21 tablet   0    BP 107/80  Pulse 89  Temp(Src) 98.2 F (36.8 C) (Oral)  Resp 18  Ht 5\' 9"  (1.753 m)  Wt 148 lb (67.132 kg)  BMI 21.85 kg/m2  SpO2 100%  LMP 07/01/2013 Physical Exam  Nursing note and vitals reviewed. Constitutional: She is oriented to person, place, and time. She  appears well-developed and well-nourished. No distress.  HENT:  Head: Normocephalic and atraumatic.  Right Ear: Tympanic membrane and ear canal normal.  Left Ear: Tympanic membrane and ear canal normal.  Nose: Mucosal edema and rhinorrhea present. Right sinus exhibits frontal sinus tenderness. Left sinus exhibits frontal sinus tenderness.  Mouth/Throat: Uvula is midline and mucous membranes are normal. No trismus in the jaw. No uvula swelling. Posterior oropharyngeal erythema present. No oropharyngeal exudate, posterior oropharyngeal edema or tonsillar abscesses.  Eyes: Conjunctivae are normal.  Neck: Normal range of motion and phonation normal. Neck supple. No Brudzinski's sign and no Kernig's sign noted.  Cardiovascular: Normal rate, regular rhythm, normal heart sounds and intact distal pulses.   No murmur heard. Pulmonary/Chest: Effort normal. No respiratory distress. She has no decreased breath sounds. She has no wheezes. She has no rales.  Coarse lung sounds bilaterally w/o wheezing or rales  Abdominal: Soft. She exhibits no distension. There is no tenderness. There is no rebound and no guarding.  Musculoskeletal: Normal range of motion. She exhibits no edema.  Lymphadenopathy:    She has no cervical adenopathy.  Neurological: She is alert and oriented to person, place, and time. She exhibits normal muscle tone. Coordination normal.  Skin: Skin is warm and dry.    ED Course  Procedures (including critical care time) Labs Review Labs Reviewed - No data to display Imaging Review No results found.   EKG Interpretation None      MDM   Final diagnoses:  Bronchitis  URI (upper respiratory infection)    VSS.  Pt is non-toxic appearing.  Agrees to saline nose spray, ibuprofen, z pack and robitussin AC for cough.    The patient appears reasonably screened and/or stabilized for discharge and I doubt any other medical condition or other [facility] requiring further screening, evaluation,  or treatment in the ED at this time prior to discharge.     Giordano Getman L. Trisha Mangleriplett, PA-C 07/13/13 1305

## 2013-07-14 NOTE — ED Provider Notes (Signed)
Medical screening examination/treatment/procedure(s) were performed by non-physician practitioner and as supervising physician I was immediately available for consultation/collaboration.   EKG Interpretation None       Haleigh Desmith, MD 07/14/13 0741 

## 2013-07-16 ENCOUNTER — Emergency Department (HOSPITAL_COMMUNITY): Payer: Self-pay

## 2013-07-16 ENCOUNTER — Encounter (HOSPITAL_COMMUNITY): Payer: Self-pay | Admitting: Emergency Medicine

## 2013-07-16 ENCOUNTER — Emergency Department (HOSPITAL_COMMUNITY)
Admission: EM | Admit: 2013-07-16 | Discharge: 2013-07-16 | Disposition: A | Discharge status: Home / Self Care | Payer: Self-pay | Attending: Emergency Medicine | Admitting: Emergency Medicine

## 2013-07-16 DIAGNOSIS — Z8701 Personal history of pneumonia (recurrent): Secondary | ICD-10-CM | POA: Insufficient documentation

## 2013-07-16 DIAGNOSIS — Z880 Allergy status to penicillin: Secondary | ICD-10-CM | POA: Insufficient documentation

## 2013-07-16 DIAGNOSIS — Z79899 Other long term (current) drug therapy: Secondary | ICD-10-CM | POA: Insufficient documentation

## 2013-07-16 DIAGNOSIS — J329 Chronic sinusitis, unspecified: Secondary | ICD-10-CM | POA: Insufficient documentation

## 2013-07-16 DIAGNOSIS — F17200 Nicotine dependence, unspecified, uncomplicated: Secondary | ICD-10-CM | POA: Insufficient documentation

## 2013-07-16 MED ORDER — PREDNISONE 20 MG PO TABS: 40.0000 mg | ORAL_TABLET | Freq: Every day | ORAL | Status: DC

## 2013-07-16 MED ORDER — FEXOFENADINE-PSEUDOEPHED ER 60-120 MG PO TB12: 1.0000 | ORAL_TABLET | Freq: Two times a day (BID) | ORAL | Status: DC | PRN

## 2013-07-16 MED ORDER — PREDNISONE 20 MG PO TABS
40.0000 mg | ORAL_TABLET | Freq: Once | ORAL | Status: AC
  Administered 2013-07-16: 40 mg via ORAL
  Filled 2013-07-16: qty 2

## 2013-07-16 NOTE — Discharge Instructions (Signed)

## 2013-07-16 NOTE — ED Provider Notes (Signed)
CSN: [id number]     Arrival date & time 07/16/13  1138 History  This chart was scribed for Laura RazorStephen Monaye Blackie, MD by Leone PayorSonum Patel, ED Scribe. This patient was seen in room APA12/[facility]12 and the patient's care was started 1:21 PM.    Chief Complaint  Patient presents with  . Cough      The history is provided by the patient. No language interpreter was used.    HPI Comments: Laura [name] is a 48 y.o. female who presents to the Emergency Department complaining of a continued, unchanged cough productive of green sputum beginning over 1 week ago. She has associated HA, chest soreness, sinus pressure, and chills. She had a sore throat which has resolved. She was seen on 07/11/13 for the same symptoms and was discharged with Z pack, saline nasal spray, ibuprofen, and Robitussin AC. She has been compliant with these medications without relief. She denies sick contacts. She denies fever, SOB, ear pain. She has a history of pneumonia.    Past Medical History  Diagnosis Date  . Pneumonia    Past Surgical History  Procedure Laterality Date  . Cesarean section    . Tubal ligation     History reviewed. No pertinent family history. History  Substance Use Topics  . Smoking status: Current Every Day Smoker -- 0.50 packs/day    Types: Cigarettes  . Smokeless tobacco: Never Used  . Alcohol Use: No   OB History   Grav Para Term Preterm Abortions TAB SAB Ect Mult Living                 Review of Systems  Constitutional: Positive for chills. Negative for fever.  HENT: Positive for sinus pressure. Negative for ear discharge and ear pain.   Respiratory: Positive for cough and chest tightness. Negative for shortness of breath.   Neurological: Positive for headaches.  All other systems reviewed and are negative.      Allergies  Penicillins and Sulfa antibiotics  Home Medications   Current Outpatient Rx  Name  Route  Sig  Dispense  Refill  . Ascorbic Acid (VITAMIN C PO)   Oral   Take 1 tablet  by mouth daily.         . diphenhydrAMINE (BENADRYL) 25 MG tablet   Oral   Take 1 tablet (25 mg total) by mouth every 6 (six) hours.   20 tablet   0   . guaiFENesin-codeine (ROBITUSSIN AC) 100-10 MG/5ML syrup   Oral   Take 10 mLs by mouth 3 (three) times daily as needed for cough.   120 mL   0   . pseudoephedrine (SUDAFED) 30 MG tablet   Oral   Take 30 mg by mouth 2 (two) times daily.         Marland Kitchen. azithromycin (ZITHROMAX Z-PAK) 250 MG tablet      Take two tablets on day one, then one tab qd days 2-5   6 tablet   0    BP 111/68  Pulse 78  Temp(Src) 98.4 F (36.9 C) (Oral)  Resp 12  Ht 5\' 9"  (1.753 m)  Wt 148 lb (67.132 kg)  BMI 21.85 kg/m2  SpO2 100%  LMP 07/01/2013 Physical Exam  Nursing note and vitals reviewed. Constitutional: She is oriented to person, place, and time. She appears well-developed and well-nourished. No distress.  HENT:  Head: Normocephalic and atraumatic.  Mouth/Throat: Oropharynx is clear and moist. No oropharyngeal exudate.  Boggy nasal mucosa. Maxillary sinus tenderness.  Eyes: EOM are normal.  Neck: Normal range of motion. Neck supple.  Cardiovascular: Normal rate, regular rhythm and normal heart sounds.   Pulmonary/Chest: Effort normal and breath sounds normal.  Abdominal: Soft. She exhibits no distension. There is no tenderness.  Musculoskeletal: Normal range of motion.  Lymphadenopathy:    She has no cervical adenopathy.  Neurological: She is alert and oriented to person, place, and time.  Skin: Skin is warm and dry.  Psychiatric: She has a normal mood and affect. Judgment normal.    ED Course  Procedures (including critical care time)  DIAGNOSTIC STUDIES: Oxygen Saturation is 100% on RA, normal by my interpretation.    COORDINATION OF CARE: 1:21 PM Discussed treatment plan with pt at bedside and pt agreed to plan.   Labs Review Labs Reviewed - No data to display Imaging Review Dg Chest 2 View  07/16/2013   CLINICAL DATA:   COUGH  EXAM: CHEST  2 VIEW  COMPARISON:  DG CHEST SPECIAL VIEW dated 05/23/2012  FINDINGS: The heart size and mediastinal contours are within normal limits. Both lungs are clear. The visualized skeletal structures are unremarkable.  IMPRESSION: No active cardiopulmonary disease.   Electronically Signed   By: Salome Holmes M.D.   On: 07/16/2013 13:24     EKG Interpretation None      MDM   Final diagnoses:  Sinusitis    47yF with likely viral sinusitis. Afebrile. Nontoxic. Finished azithromycin w/o change in symptoms. May be some bronchitic component with persistent cough or possibly from PND. Lungs clear. Plan continued symptomatic tx. Return precautions discussed.   I personally preformed the services scribed in my presence. The recorded information has been reviewed is accurate. Laura Razor, MD.   Laura Razor, MD 07/16/13 [phone]

## 2013-07-16 NOTE — ED Notes (Signed)
Pt c/o continued productive cough (green sputum)/congestion with weakness and chills since being seen in ED 3/18 and dx with bronchitis/uri. Pt states she finished po antibiotic today and could not get appointment with pcp until mid-April.

## 2013-07-22 ENCOUNTER — Emergency Department (HOSPITAL_COMMUNITY)
Admission: EM | Admit: 2013-07-22 | Discharge: 2013-07-22 | Disposition: A | Discharge status: Home / Self Care | Payer: Self-pay | Attending: Emergency Medicine | Admitting: Emergency Medicine

## 2013-07-22 ENCOUNTER — Encounter (HOSPITAL_COMMUNITY): Payer: Self-pay | Admitting: Emergency Medicine

## 2013-07-22 DIAGNOSIS — R05 Cough: Secondary | ICD-10-CM | POA: Insufficient documentation

## 2013-07-22 DIAGNOSIS — H6690 Otitis media, unspecified, unspecified ear: Secondary | ICD-10-CM | POA: Insufficient documentation

## 2013-07-22 DIAGNOSIS — R059 Cough, unspecified: Secondary | ICD-10-CM | POA: Insufficient documentation

## 2013-07-22 DIAGNOSIS — H6980 Other specified disorders of Eustachian tube, unspecified ear: Secondary | ICD-10-CM

## 2013-07-22 DIAGNOSIS — H9190 Unspecified hearing loss, unspecified ear: Secondary | ICD-10-CM | POA: Insufficient documentation

## 2013-07-22 DIAGNOSIS — J3489 Other specified disorders of nose and nasal sinuses: Secondary | ICD-10-CM | POA: Insufficient documentation

## 2013-07-22 DIAGNOSIS — H6990 Unspecified Eustachian tube disorder, unspecified ear: Secondary | ICD-10-CM | POA: Insufficient documentation

## 2013-07-22 DIAGNOSIS — Z880 Allergy status to penicillin: Secondary | ICD-10-CM | POA: Insufficient documentation

## 2013-07-22 DIAGNOSIS — F17200 Nicotine dependence, unspecified, uncomplicated: Secondary | ICD-10-CM | POA: Insufficient documentation

## 2013-07-22 DIAGNOSIS — Z79899 Other long term (current) drug therapy: Secondary | ICD-10-CM | POA: Insufficient documentation

## 2013-07-22 DIAGNOSIS — Z8701 Personal history of pneumonia (recurrent): Secondary | ICD-10-CM | POA: Insufficient documentation

## 2013-07-22 MED ORDER — AZITHROMYCIN 250 MG PO TABS: 250.0000 mg | ORAL_TABLET | Freq: Every day | ORAL | Status: DC

## 2013-07-22 MED ORDER — ANTIPYRINE-BENZOCAINE 5.4-1.4 % OT SOLN
3.0000 [drp] | Freq: Once | OTIC | Status: AC
  Administered 2013-07-22: 4 [drp] via OTIC
  Filled 2013-07-22: qty 10

## 2013-07-22 NOTE — ED Notes (Signed)
PT C/O BILATERAL EAR PAIN AND FEELING AS IF THEY ARE "STOPPED UP."

## 2013-07-22 NOTE — Discharge Instructions (Signed)
Otitis Media, Adult Otitis media is redness, soreness, and swelling (inflammation) of the middle ear. Otitis media may be caused by allergies or, most commonly, by infection. Often it occurs as a complication of the common cold. SIGNS AND SYMPTOMS Symptoms of otitis media may include:  Earache.  Fever.  Ringing in your ear.  Headache.  Leakage of fluid from the ear. DIAGNOSIS To diagnose otitis media, your health care provider will examine your ear with an otoscope. This is an instrument that allows your health care provider to see into your ear in order to examine your eardrum. Your health care provider also will ask you questions about your symptoms. TREATMENT  Typically, otitis media resolves on its own within 3 5 days. Your health care provider may prescribe medicine to ease your symptoms of pain. If otitis media does not resolve within 5 days or is recurrent, your health care provider may prescribe antibiotic medicines if he or she suspects that a bacterial infection is the cause. HOME CARE INSTRUCTIONS   Take your medicine as directed until it is gone, even if you feel better after the first few days.  Only take over-the-counter or prescription medicines for pain, discomfort, or fever as directed by your health care provider.  Follow up with your health care provider as directed. SEEK MEDICAL CARE IF:  You have otitis media only in one ear or bleeding from your nose or both.  You notice a lump on your neck.  You are not getting better in 3 5 days.  You feel worse instead of better. SEEK IMMEDIATE MEDICAL CARE IF:   You have pain that is not controlled with medicine.  You have swelling, redness, or pain around your ear or stiffness in your neck.  You notice that part of your face is paralyzed.  You notice that the bone behind your ear (mastoid) is tender when you touch it. MAKE SURE YOU:   Understand these instructions.  Will watch your condition.  Will get help  right away if you are not doing well or get worse. Document Released: 01/16/2004 Document Revised: 01/31/2013 Document Reviewed: 11/07/2012 [facility] Patient Information 2014 [employer], [place].   Continue with your current treatments.  I encourage you to also add a menthol cough drop or a strong peppermint which can help open up your sinus passages.  You may also want to try a Vicks vapor stick which can help with congestion as well.

## 2013-07-22 NOTE — ED Notes (Signed)
Pt seen and evaluated by EDPa for initial assessment. 

## 2013-07-26 NOTE — ED Provider Notes (Signed)
CSN: [id number]     Arrival date & time 07/22/13  1859 History   First MD Initiated Contact with Patient 07/22/13 1916     Chief Complaint  Patient presents with  . Otalgia     (Consider location/radiation/quality/duration/timing/severity/associated sxs/prior Treatment) HPI Comments: Laura [name] is a 48 y.o. Female presenting with a history of an approximate 2 week history uri symptoms which included nasal congestion, with thick nasal discharge, post nasal drip with cough occasionally productive of green sputum, although this symptom is improving, but has now developed worsening bilateral ear pain, left greater than right, and bilateral muffled hearing acuity.  She has had subjective fevers and also reports sinus congestion and pressure.  She denies sob and sore throat, dizziness and chest pain. She completed a course of zithromax one week ago and also used robitussin AC and nasal saline spray with improvement in her symptoms, now taking allegra d, but now with ear symptoms as described.   The history is provided by the patient.    Past Medical History  Diagnosis Date  . Pneumonia    Past Surgical History  Procedure Laterality Date  . Cesarean section    . Tubal ligation     History reviewed. No pertinent family history. History  Substance Use Topics  . Smoking status: Current Every Day Smoker -- 0.50 packs/day    Types: Cigarettes  . Smokeless tobacco: Never Used  . Alcohol Use: No   OB History   Grav Para Term Preterm Abortions TAB SAB Ect Mult Living                 Review of Systems  Constitutional: Positive for fever and chills.  HENT: Positive for congestion, ear pain, hearing loss, rhinorrhea and sinus pressure. Negative for ear discharge, sore throat, trouble swallowing and voice change.   Eyes: Negative for discharge.  Respiratory: Positive for cough. Negative for shortness of breath, wheezing and stridor.   Cardiovascular: Negative for chest pain.   Gastrointestinal: Negative for abdominal pain.  Genitourinary: Negative.       Allergies  Penicillins and Sulfa antibiotics  Home Medications   Current Outpatient Rx  Name  Route  Sig  Dispense  Refill  . Ascorbic Acid (VITAMIN C PO)   Oral   Take 1 tablet by mouth daily.         . fexofenadine-pseudoephedrine (ALLEGRA-D) 60-120 MG per tablet   Oral   Take 1 tablet by mouth 2 (two) times daily as needed (congestion).   20 tablet   0   . azithromycin (ZITHROMAX) 250 MG tablet   Oral   Take 1 tablet (250 mg total) by mouth daily. Take first 2 tablets together, then 1 every day until finished.   6 tablet   0    BP 108/63  Pulse 91  Temp(Src) 97.9 F (36.6 C) (Oral)  Resp 20  Ht 5\' 9"  (1.753 m)  Wt 148 lb (67.132 kg)  BMI 21.85 kg/m2  SpO2 100%  LMP 07/01/2013 Physical Exam  Constitutional: She is oriented to person, place, and time. Vital signs are normal. She appears well-developed and well-nourished.  Non-toxic appearance.  HENT:  Head: Normocephalic and atraumatic.  Right Ear: Ear canal normal. A middle ear effusion is present. No decreased hearing is noted.  Left Ear: Ear canal normal. Tympanic membrane is injected and bulging. A middle ear effusion is present. No decreased hearing is noted.  Nose: Mucosal edema and rhinorrhea present.  Mouth/Throat: Uvula is midline,  oropharynx is clear and moist and mucous membranes are normal. No oropharyngeal exudate, posterior oropharyngeal edema, posterior oropharyngeal erythema or tonsillar abscesses.  Eyes: Conjunctivae are normal.  Cardiovascular: Normal rate and normal heart sounds.   Pulmonary/Chest: Effort normal. No respiratory distress. She has no wheezes. She has no rales.  Abdominal: Soft. There is no tenderness.  Musculoskeletal: Normal range of motion.  Neurological: She is alert and oriented to person, place, and time.  Skin: Skin is warm and dry. No rash noted.  Psychiatric: She has a normal mood and  affect.    ED Course  Procedures (including critical care time) Labs Review Labs Reviewed - No data to display Imaging Review No results found.   EKG Interpretation None      MDM   Final diagnoses:  Otitis media  Eustachian tube dysfunction    Pt with recent uri, now with sx and exam c/w otitis media.  She has completed a round of zithromax, but now with new otitis.  She was given a repeat zithromax dose pack,  As is pcn, sulfa allergic, would not be compliant with clindamycin or clarithromycin secondary to cost. Given auralgan drops for pain relief.  Encouraged ibuprofen.  Encouraged to continue her allegra d.  The patient appears reasonably screened and/or stabilized for discharge and I doubt any other medical condition or other [facility] requiring further screening, evaluation, or treatment in the ED at this time prior to discharge.   Burgess AmorJulie Alanmichael Barmore, PA-C 07/26/13 [id number]

## 2013-07-26 NOTE — ED Provider Notes (Signed)
Medical screening examination/treatment/procedure(s) were performed by non-physician practitioner and as supervising physician I was immediately available for consultation/collaboration.   EKG Interpretation None       Jakhai Fant, MD 07/26/13 1604 

## 2013-12-04 ENCOUNTER — Observation Stay (HOSPITAL_COMMUNITY): Payer: Self-pay

## 2013-12-04 ENCOUNTER — Observation Stay (HOSPITAL_COMMUNITY)
Admission: EM | Admit: 2013-12-04 | Discharge: 2013-12-05 | Disposition: A | Discharge status: Home / Self Care | Payer: Self-pay | Attending: Cardiology | Admitting: Cardiology

## 2013-12-04 ENCOUNTER — Encounter (HOSPITAL_COMMUNITY): Payer: Self-pay | Admitting: Emergency Medicine

## 2013-12-04 DIAGNOSIS — I471 Supraventricular tachycardia: Secondary | ICD-10-CM | POA: Diagnosis present

## 2013-12-04 DIAGNOSIS — I248 Other forms of acute ischemic heart disease: Principal | ICD-10-CM | POA: Insufficient documentation

## 2013-12-04 DIAGNOSIS — Z720 Tobacco use: Secondary | ICD-10-CM

## 2013-12-04 DIAGNOSIS — I472 Ventricular tachycardia, unspecified: Secondary | ICD-10-CM | POA: Insufficient documentation

## 2013-12-04 DIAGNOSIS — Z8249 Family history of ischemic heart disease and other diseases of the circulatory system: Secondary | ICD-10-CM | POA: Insufficient documentation

## 2013-12-04 DIAGNOSIS — I2489 Other forms of acute ischemic heart disease: Principal | ICD-10-CM | POA: Insufficient documentation

## 2013-12-04 DIAGNOSIS — I4729 Other ventricular tachycardia: Secondary | ICD-10-CM | POA: Insufficient documentation

## 2013-12-04 DIAGNOSIS — F17200 Nicotine dependence, unspecified, uncomplicated: Secondary | ICD-10-CM | POA: Insufficient documentation

## 2013-12-04 DIAGNOSIS — R000 Tachycardia, unspecified: Secondary | ICD-10-CM

## 2013-12-04 HISTORY — DX: Bronchitis, not specified as acute or chronic: J40

## 2013-12-04 HISTORY — DX: Otitis media, unspecified, unspecified ear: H66.90

## 2013-12-04 LAB — TROPONIN I
Troponin I: <0.3 ng/mL (ref <0.30)
Troponin I: <0.3 ng/mL (ref <0.30)

## 2013-12-04 LAB — CBC
HCT: 39.9 % (ref 36.0–46.0)
HEMATOCRIT: 41.8 % (ref 36.0–46.0)
HEMOGLOBIN: 13.1 g/dL (ref 12.0–15.0)
Hemoglobin: 14.3 g/dL (ref 12.0–15.0)
MCH: 30.8 pg (ref 26.0–34.0)
MCH: 30.5 pg (ref 26.0–34.0)
MCHC: 34.2 g/dL (ref 30.0–36.0)
MCHC: 32.8 g/dL (ref 30.0–36.0)
MCV: 90.1 fL (ref 78.0–100.0)
MCV: 93 fL (ref 78.0–100.0)
PLATELETS: 237 10*3/uL (ref 150–400)
Platelets: 185 10*3/uL (ref 150–400)
RBC: 4.64 MIL/uL (ref 3.87–5.11)
RBC: 4.29 MIL/uL (ref 3.87–5.11)
RDW: 13.3 % (ref 11.5–15.5)
RDW: 13.6 % (ref 11.5–15.5)
WBC: 13.5 10*3/uL — AB (ref 4.0–10.5)
WBC: 7.5 10*3/uL (ref 4.0–10.5)

## 2013-12-04 LAB — BASIC METABOLIC PANEL
ANION GAP: 13 (ref 5–15)
BUN: 22 mg/dL (ref 6–23)
CHLORIDE: 105 meq/L (ref 96–112)
CO2: 21 mEq/L (ref 19–32)
CREATININE: 0.73 mg/dL (ref 0.50–1.10)
Calcium: 9.5 mg/dL (ref 8.4–10.5)
GFR calc Af Amer: >90 mL/min (ref >90)
GFR calc non Af Amer: >90 mL/min (ref >90)
Glucose, Bld: 123 mg/dL — ABNORMAL HIGH (ref 70–99)
Potassium: 3.5 mEq/L — ABNORMAL LOW (ref 3.7–5.3)
Sodium: 139 mEq/L (ref 137–147)

## 2013-12-04 LAB — TSH: TSH: 2.56 u[IU]/mL (ref 0.350–4.500)

## 2013-12-04 LAB — I-STAT TROPONIN, ED: Troponin i, poc: 1.07 ng/mL (ref 0.00–0.08)

## 2013-12-04 LAB — CREATININE, SERUM: Creatinine, Ser: 0.68 mg/dL (ref 0.50–1.10)

## 2013-12-04 MED ORDER — POTASSIUM CHLORIDE CRYS ER 20 MEQ PO TBCR
40.0000 meq | EXTENDED_RELEASE_TABLET | Freq: Once | ORAL | Status: AC
  Administered 2013-12-04: 40 meq via ORAL
  Filled 2013-12-04: qty 2

## 2013-12-04 MED ORDER — NITROGLYCERIN 0.4 MG SL SUBL: 0.4000 mg | SUBLINGUAL_TABLET | SUBLINGUAL | Status: DC | PRN

## 2013-12-04 MED ORDER — ASPIRIN EC 81 MG PO TBEC
81.0000 mg | DELAYED_RELEASE_TABLET | Freq: Every day | ORAL | Status: DC
  Filled 2013-12-04: qty 1

## 2013-12-04 MED ORDER — ASPIRIN 81 MG PO CHEW
CHEWABLE_TABLET | ORAL | Status: AC
  Filled 2013-12-04: qty 4

## 2013-12-04 MED ORDER — LORAZEPAM 2 MG/ML IJ SOLN
1.0000 mg | Freq: Once | INTRAMUSCULAR | Status: AC
  Administered 2013-12-04: 1 mg via INTRAVENOUS

## 2013-12-04 MED ORDER — ACETAMINOPHEN 325 MG PO TABS: 650.0000 mg | ORAL_TABLET | ORAL | Status: DC | PRN

## 2013-12-04 MED ORDER — DILTIAZEM HCL 30 MG PO TABS
30.0000 mg | ORAL_TABLET | Freq: Four times a day (QID) | ORAL | Status: DC | PRN
  Filled 2013-12-04: qty 1

## 2013-12-04 MED ORDER — ONDANSETRON HCL 4 MG/2ML IJ SOLN: 4.0000 mg | Freq: Four times a day (QID) | INTRAMUSCULAR | Status: DC | PRN

## 2013-12-04 MED ORDER — ASPIRIN 81 MG PO CHEW: 324.0000 mg | CHEWABLE_TABLET | ORAL | Status: AC

## 2013-12-04 MED ORDER — LORAZEPAM 2 MG/ML IJ SOLN
INTRAMUSCULAR | Status: AC
  Filled 2013-12-04: qty 1

## 2013-12-04 MED ORDER — ASPIRIN 300 MG RE SUPP: 300.0000 mg | RECTAL | Status: AC

## 2013-12-04 MED ORDER — HYDROGEN PEROXIDE 3 % EX SOLN
CUTANEOUS | Status: AC
  Filled 2013-12-04: qty 473

## 2013-12-04 MED ORDER — NICOTINE 21 MG/24HR TD PT24
21.0000 mg | MEDICATED_PATCH | Freq: Every day | TRANSDERMAL | Status: DC
  Administered 2013-12-04 – 2013-12-05 (×2): 21 mg via TRANSDERMAL
  Filled 2013-12-04 (×2): qty 1

## 2013-12-04 MED ORDER — HEPARIN SODIUM (PORCINE) 5000 UNIT/ML IJ SOLN
5000.0000 [IU] | Freq: Three times a day (TID) | INTRAMUSCULAR | Status: DC
  Administered 2013-12-04 – 2013-12-05 (×2): 5000 [IU] via SUBCUTANEOUS
  Filled 2013-12-04 (×4): qty 1

## 2013-12-04 MED ORDER — ASPIRIN 81 MG PO CHEW
324.0000 mg | CHEWABLE_TABLET | Freq: Once | ORAL | Status: AC
  Administered 2013-12-04: 324 mg via ORAL

## 2013-12-04 NOTE — ED Provider Notes (Signed)
CSN: [id number]     Arrival date & time 12/04/13  0221 History   First MD Initiated Contact with Patient 12/04/13 0235   Chief Complaint  Patient presents with  . Palpitations     (Consider location/radiation/quality/duration/timing/severity/associated sxs/prior Treatment) HPI This is a 48 year old female who was watching TV about 30-45 minutes prior to arrival. She had the sudden onset of a rapid pounding heart rate. This was associated with shortness of breath and lightheadedness but no actual chest pain. The symptoms made her very anxious. While in route she took one of her husband's nitroglycerin tablets which seemed to lessen its severity and by the time she arrived here her palpitations had resolved. The nitroglycerin did give her headache. She denies diaphoresis or nausea. She was noted to be hypotensive on arrival but states that she has a history of low blood pressures.   Past Medical History  Diagnosis Date  . Pneumonia    Past Surgical History  Procedure Laterality Date  . Cesarean section    . Tubal ligation     Family History  Problem Relation Age of Onset  . CAD Father    History  Substance Use Topics  . Smoking status: Current Every Day Smoker -- 0.50 packs/day    Types: Cigarettes  . Smokeless tobacco: Never Used  . Alcohol Use: No   OB History   Grav Para Term Preterm Abortions TAB SAB Ect Mult Living                 Review of Systems  All other systems reviewed and are negative.   Allergies  Penicillins and Sulfa antibiotics  Home Medications   Prior to Admission medications   Medication Sig Start Date End Date Taking? Authorizing Provider  Ascorbic Acid (VITAMIN C PO) Take 1 tablet by mouth daily.    Historical Provider, MD  azithromycin (ZITHROMAX) 250 MG tablet Take 1 tablet (250 mg total) by mouth daily. Take first 2 tablets together, then 1 every day until finished. 07/22/13   Burgess AmorJulie Idol, PA-C  fexofenadine-pseudoephedrine (ALLEGRA-D) 60-120 MG  per tablet Take 1 tablet by mouth 2 (two) times daily as needed (congestion). 07/16/13   Raeford RazorStephen Kohut, MD   BP 111/74  Pulse 55  Temp(Src) 98.1 F (36.7 C) (Oral)  Resp 22  Ht 5\' 9"  (1.753 m)  Wt 143 lb (64.864 kg)  BMI 21.11 kg/m2  SpO2 100%  LMP 11/20/2013  Physical Exam General: Well-developed, well-nourished female in no acute distress; appearance consistent with age of record HENT: normocephalic; atraumatic Eyes: pupils equal, round and reactive to light; extraocular muscles intact Neck: supple Heart: regular rate and rhythm; no murmur; no ectopy Lungs: clear to auscultation bilaterally Abdomen: soft; nondistended; nontender; bowel sounds present Extremities: No deformity; full range of motion; pulses normal Neurologic: Awake, alert and oriented; motor function intact in all extremities and symmetric; no facial droop Skin: Warm and dry Psychiatric: Anxious    ED Course  Procedures (including critical care time)  CRITICAL CARE Performed by: Doyl Bitting L Total critical care time: 35 minutes Critical care time was exclusive of separately billable procedures and treating other patients. Critical care was necessary to treat or prevent imminent or life-threatening deterioration. Critical care was time spent personally by me on the following activities: development of treatment plan with patient and/or surrogate as well as nursing, discussions with consultants, evaluation of patient's response to treatment, examination of patient, obtaining history from patient or surrogate, ordering and performing treatments and interventions, ordering and  review of laboratory studies, ordering and review of radiographic studies, pulse oximetry and re-evaluation of patient's condition.Marland Kitchenl  MDM   EKG Interpretation:  Date & Time: 12/04/2013 2:52 AM  Rate: 74  Rhythm: normal sinus rhythm  QRS Axis: normal  Intervals: normal  ST/T Wave abnormalities: normal  Conduction Disutrbances:none   Narrative Interpretation: poor R-wave progression  Old EKG Reviewed: none available  EKG Interpretation:  Date & Time: 12/04/2013 7:14 AM  Rate: 69  Rhythm: normal sinus rhythm  QRS Axis: normal  Intervals: normal  ST/T Wave abnormalities: normal  Conduction Disutrbances:none  Narrative Interpretation: poor R-wave progression  Old EKG Reviewed: unchanged   Nursing notes and vitals signs, including pulse oximetry, reviewed.  Summary of this visit's results, reviewed by myself:  Labs:  Results for orders placed during the hospital encounter of 12/04/13 (from the past 24 hour(s))  TROPONIN I     Status: None   Collection Time    12/04/13  2:55 AM      Result Value Ref Range   Troponin I <0.30  <0.30 ng/mL  CBC     Status: Abnormal   Collection Time    12/04/13  2:55 AM      Result Value Ref Range   WBC 13.5 (*) 4.0 - 10.5 K/uL   RBC 4.64  3.87 - 5.11 MIL/uL   Hemoglobin 14.3  12.0 - 15.0 g/dL   HCT 16.1  09.6 - 04.5 %   MCV 90.1  78.0 - 100.0 fL   MCH 30.8  26.0 - 34.0 pg   MCHC 34.2  30.0 - 36.0 g/dL   RDW 40.9  81.1 - 91.4 %   Platelets 237  150 - 400 K/uL  BASIC METABOLIC PANEL     Status: Abnormal   Collection Time    12/04/13  2:55 AM      Result Value Ref Range   Sodium 139  137 - 147 mEq/L   Potassium 3.5 (*) 3.7 - 5.3 mEq/L   Chloride 105  96 - 112 mEq/L   CO2 21  19 - 32 mEq/L   Glucose, Bld 123 (*) 70 - 99 mg/dL   BUN 22  6 - 23 mg/dL   Creatinine, Ser 7.82  0.50 - 1.10 mg/dL   Calcium 9.5  8.4 - 95.6 mg/dL   GFR calc non Af Amer >90  >90 mL/min   GFR calc Af Amer >90  >90 mL/min   Anion gap 13  5 - 15  I-STAT TROPOININ, ED     Status: Abnormal   Collection Time    12/04/13  5:53 AM      Result Value Ref Range   Troponin i, poc 1.07 (*) 0.00 - 0.08 ng/mL   Comment NOTIFIED PHYSICIAN     Comment 3           TROPONIN I     Status: None   Collection Time    12/04/13  6:28 AM      Result Value Ref Range   Troponin I <0.30  <0.30 ng/mL   6:30  AM Second troponin is positive for myocardial damage. Aspirin given. Repeat EKG is unchanged. [facility] consult cardiology for transfer to [facility]. Patient continues to be asymptomatic in the ED.   6:42 AM Dr. Anne Fu accepts for transfer to [facility]. He advises against heparin at this time. Diagnosis: demand ischemia secondary to tachyarrhythmia. He requests a confirmatory troponin in the main lab.     Ellianah Cordy L  Daryl Quiros, MD 12/04/13 1610

## 2013-12-04 NOTE — ED Notes (Signed)
Patient states she took her husband's nitro pill. Denies chest pain at this time

## 2013-12-04 NOTE — Progress Notes (Signed)
Echocardiogram 2D Echocardiogram has been performed.  Mitcheal Sweetin 12/04/2013, 1:07 PM

## 2013-12-04 NOTE — H&P (Signed)
Admit date: 12/04/2013 Primary Physician  [facility] Primary [facility]  None  CC: Pounding heart  HPI: 48 year-old female smoker whose father died at age 48 from myocardial infarction who presented to a knee pain emergency department and was seen by Dr. Read DriversMolpus with complaints of tachycardia.  This started at approximately 1:30 AM this morning. Described as a pounding, fast heart rate. She asked her daughter to come listen to her and her daughter could feel her heart pounding in her chest at a rapid pace. The heart rate seemed to decrease suddenly but she is unsure. She took one of her husbands nitroglycerin to see if this would help. He went to the emergency department for further evaluation.  While in the emergency department, the first troponin was normal, serum. A second troponin, point-of-care was drawn and this was abnormal. A third troponin, serum once again, was drawn and this was normal. There is concern for potential demand ischemia and [facility].  She denies any recent fevers, chills, cough, syncope. She did feel somewhat dizzy with her pounding heart. She denied any chest pain that was significant at that time.  Approximately 2 months ago she had a similar episode that lasted less than an hours duration. Occasionally she will feel palpitations.  Currently she is in sinus rhythm. There was not in EKG or telemetry that demonstrated her possible SVT.  She seems quite tired.    PMH:   Past Medical History  Diagnosis Date  . Pneumonia   . Otitis media 06/2013  . Bronchitis 06/2013    PSH:   Past Surgical History  Procedure Laterality Date  . Cesarean section    . Tubal ligation     Allergies:  Penicillins and Sulfa antibiotics Prior to Admit Meds:   Prior to Admission medications   Not on File   Fam HX:    Family History  Problem Relation Age of Onset  . CAD Father    Social HX:   Denies drug use. Denies  alcohol use. History   Social History  . Marital Status: Single    Spouse Name: N/A    Number of Children: N/A  . Years of Education: N/A   Occupational History  . Not on file.   Social History Main Topics  . Smoking status: Current Every Day Smoker -- 0.50 packs/day    Types: Cigarettes  . Smokeless tobacco: Never Used  . Alcohol Use: No  . Drug Use: No  . Sexual Activity: No   Other Topics Concern  . Not on file   Social History Narrative  . No narrative on file     ROS:  All 11 ROS were addressed and are negative except what is stated in the HPI   Physical Exam: Blood pressure 97/58, pulse 60, temperature 98.2 F (36.8 C), temperature source Oral, resp. rate 18, height 5\' 9"  (1.753 m), weight 143 lb (64.864 kg), last menstrual period 11/20/2013, SpO2 99.00%.   General: Well developed, well nourished, in no acute distress Head: Eyes PERRLA, No xanthomas.   Normal cephalic and atramatic  Lungs:  Clear bilaterally to auscultation and percussion. Normal respiratory effort. No wheezes, no rales. Heart:  HRRR S1 S2 Pulses are 2+ & equal. No murmurs, rubs or gallops.             No carotid bruit. No JVD.  No abdominal bruits. Abdomen: Bowel sounds are positive, abdomen soft and non-tender without masses  or                 Hernia's noted. No hepatosplenomegaly. Msk:  Back normal, normal gait. Normal strength and tone for age. Extremities:  No clubbing, cyanosis or edema.  DP +1 Neuro: Alert and oriented X 3, non-focal, MAE x 4 GU: Deferred Rectal: Deferred Psych:  Good affect, responds appropriately         Labs:   Lab Results  Component Value Date   WBC 13.5* 12/04/2013   HGB 14.3 12/04/2013   HCT 41.8 12/04/2013   MCV 90.1 12/04/2013   PLT 237 12/04/2013    Recent Labs Lab 12/04/13 0255  NA 139  K 3.5*  CL 105  CO2 21  BUN 22  CREATININE 0.73  CALCIUM 9.5  GLUCOSE 123*    Recent Labs  12/04/13 0255 12/04/13 0628  TROPONINI <0.30 <0.30    EKG:   Sinus rhythm with no other abnormalities Personally viewed.  ASSESSMENT/PLAN:   48 year old female smoker with early family history of CAD with possible PSVT.  1. Tachycardia/PSVT/palpitations-continue on telemetry. Point-of-care troponin was abnormally elevated but repeat serum troponin was normal. There did not seem to be any confirming evidence of demand ischemia. During a prior episode she may have felt a sensation in her chest during her tachycardia. I will check an echocardiogram. Continue to observe on telemetry. Her blood pressure is fairly low and because of this, I would advocate as needed short-acting diltiazem if her tachycardia returns. I also taught her Valsalva maneuver to hopefully help terminate if this is in fact a reentrant tachycardia.  Ultimately, if we do not catch on telemetry over the next 24 hours, I would advocate an event monitor. If PSVT is discovered, electrophysiology referral would be helpful for ablation given her low normal blood pressure.  I will check a TSH. I will also check a chest x-ray. White count mildly elevated but no fevers.  At this point since she's not having any chest discomfort, serum troponins are normal, I will forego stress test. If she demonstrates any signs of angina with activity, consider stress test.  2. Tobacco use-strongly encourage cessation.  3. Early family history of CAD-father myocardial infarction 27.  4. Leukocytosis-I will check a chest x-ray. History of smoking.  5. Mild hypokalemia-I will replete  Donato Schultz, MD  12/04/2013  10:29 AM

## 2013-12-04 NOTE — ED Notes (Signed)
Pt reports feeling heart racing.  States symptoms began about 45 min ago.  Reports taking 1 SL nitro.  Denies nausea or vomiting at this time.  Reporting feeling some tingling in left arm.

## 2013-12-05 ENCOUNTER — Other Ambulatory Visit: Payer: Self-pay | Admitting: Physician Assistant

## 2013-12-05 ENCOUNTER — Telehealth: Payer: Self-pay | Admitting: Physician Assistant

## 2013-12-05 DIAGNOSIS — R002 Palpitations: Secondary | ICD-10-CM

## 2013-12-05 LAB — CBC
HCT: 40.5 % (ref 36.0–46.0)
HEMOGLOBIN: 13.2 g/dL (ref 12.0–15.0)
MCH: 30.1 pg (ref 26.0–34.0)
MCHC: 32.6 g/dL (ref 30.0–36.0)
MCV: 92.5 fL (ref 78.0–100.0)
Platelets: 223 10*3/uL (ref 150–400)
RBC: 4.38 MIL/uL (ref 3.87–5.11)
RDW: 13.5 % (ref 11.5–15.5)
WBC: 9.1 10*3/uL (ref 4.0–10.5)

## 2013-12-05 LAB — LIPID PANEL
Cholesterol: 122 mg/dL (ref 0–200)
HDL: 40 mg/dL (ref >39)
LDL Cholesterol: 60 mg/dL (ref 0–99)
TRIGLYCERIDES: 109 mg/dL (ref <150)
Total CHOL/HDL Ratio: 3.1 RATIO
VLDL: 22 mg/dL (ref 0–40)

## 2013-12-05 MED ORDER — DILTIAZEM HCL 30 MG PO TABS: 30.0000 mg | ORAL_TABLET | Freq: Four times a day (QID) | ORAL | Status: DC | PRN

## 2013-12-05 NOTE — Telephone Encounter (Signed)
Patient tried to pick up the 30 days event monitor [facility], however since she had no insurance, the office staff who handles insurance if out of the office until next Monday 8/17. I have contacted our [place] office to see if they have 30 day event monitor as patient actually live in [place]. I have asked our [place] office to see if they can assist the patient in the mean time given her financial problem. She may be better suited follow up either with our [place] office or [place] office depend on her preference.  Ramond DialSigned, Elwood Bazinet PA Pager: [phone]

## 2013-12-05 NOTE — Telephone Encounter (Signed)
[facility]. If she wishes to followup in [place] this is fine with me.  Donato SchultzSKAINS, MARK, MD

## 2013-12-05 NOTE — Discharge Summary (Signed)
Discharge Summary   Patient ID: Laura [name],  MRN: [id number], DOB/AGE: 48/20/1967 48 y.o.  Admit date: 12/04/2013 Discharge date: 12/05/2013  Primary Care Provider: [facility] Primary Cardiologist: New - Dr. Anne Fu  Discharge Diagnoses Principal Problem:   PSVT (paroxysmal supraventricular tachycardia) Active Problems:   Family history of ischemic heart disease   Tobacco use   Allergies Allergies  Allergen Reactions  . Penicillins Shortness Of Breath  . Sulfa Antibiotics Other (See Comments)    blisters    Procedures  LV EF: 60% - 65%  ------------------------------------------------------------------- Indications: Supraventricular tachycardia 427.0.  ------------------------------------------------------------------- History: Risk factors: Family history of coronary artery disease. Current tobacco use.  ------------------------------------------------------------------- Study Conclusions  - Left ventricle: The cavity size was normal. Wall thickness was normal. Systolic function was normal. The estimated ejection fraction was in the range of 60% to 65%. Wall motion was normal; there were no regional wall motion abnormalities. Left ventricular diastolic function parameters were normal. - Aortic valve: Poorly visualized. Probably trileaflet; mildly calcified leaflets. - Mitral valve: There was no regurgitation. - Right ventricle: The cavity size was normal. Systolic function was normal. - Pulmonary arteries: No complete TR doppler jet so unable to estimate PA systolic pressure. - Inferior vena cava: The vessel was normal in size. The respirophasic diameter changes were in the normal range (>= 50%), consistent with normal central venous pressure.  Impressions:  - Normal LV size with EF 60-65%. Normal diastolic function. Normal RV size and systolic function. No significant valvular abnormalities noted.   Hospital Course  The patient  is a 48 year old Caucasian female with past medical history significant for tobacco abuse and significant family history of early heart attack in both her mother and her paternal grandmother. Patient has been having intermittent palpitation occurring once every 2 weeks. She presented to [facility] ED on 12/04/2013 after waking up with significant palpitation and a fast heart rate. She asked her daughter to listen to her chest and it was noted that her heart rate was very fast however her heart rate seems to decreased suddenly. She took her husband's nitroglycerin prior to ED visit with some improvement. According to the patient, she spent 2 hours detailing her car on the day prior without significant chest discomfort or shortness breath. She denies any recent fever, chill, cough, or syncope. However she does drink a lot of [employer] and caffeinated drinks. Her symptom is most consistent with either SVT or proxysmal atrial tachycardia. The first point-of-care troponin in the ED was elevated at 1.07, however troponin I obtained subsequently was negative. Serial troponin was negative as well. TSH was normal. Echocardiogram was obtained on 12/04/2013 which showed EF 60-65%, with normal wall motion, normal diastolic function. Patient was able to ambulate in the hallway without significant chest discomfort. Overnight telemetry did show intermittent sinus tachycardia with heart rate in the 120s, however she was asymptomatic with those heart rate and majority of her heart rate has been stable in the 60s.  Given her current lack of symptom, she is deemed stable for discharge. We will set up a 30 day event monitor after discharge. We were unable to give her any scheduled medication for rate control during admission because her relative hypotension with systolic blood pressure remained in the high 90s. We will discharge her with PRN 30 mg Short-acting diltiazem for symptom onset. Given her significant family history of  early coronary disease, it is not inappropriate to obtain outpatient stress test later if her symptom increase  in frequency. Patient was seen the morning of 12/05/2013, Dr. Elease HashimotoNahser has explained to the patient valsalva maneuver and diving reflex to potentially stop any tachycardia assuming  it's SVT. She has also urged to stop smoking and cut back on [facility]ountain Dew, M&Ms and high carbohydrate and caffeinated beverage. We have recommended V8 juice or water with electrolyte tabs.    Discharge Vitals Blood pressure 92/56, pulse 61, temperature 97.8 F (36.6 C), temperature source Oral, resp. rate 18, height 5\' 9"  (1.753 m), weight 138 lb 12.8 oz (62.959 kg), last menstrual period 11/20/2013, SpO2 99.00%.  Filed Weights   12/04/13 0238 12/05/13 0547  Weight: 143 lb (64.864 kg) 138 lb 12.8 oz (62.959 kg)    Labs  CBC  Recent Labs  12/04/13 1200 12/05/13 0323  WBC 7.5 9.1  HGB 13.1 13.2  HCT 39.9 40.5  MCV 93.0 92.5  PLT 185 223   Basic Metabolic Panel  Recent Labs  12/04/13 0255 12/04/13 1200  NA 139  --   K 3.5*  --   CL 105  --   CO2 21  --   GLUCOSE 123*  --   BUN 22  --   CREATININE 0.73 0.68  CALCIUM 9.5  --    Cardiac Enzymes  Recent Labs  12/04/13 0255 12/04/13 0628  TROPONINI <0.30 <0.30  Fasting Lipid Panel  Recent Labs  12/05/13 0323  CHOL 122  HDL 40  LDLCALC 60  TRIG 109  CHOLHDL 3.1   Thyroid Function Tests  Recent Labs  12/04/13 1200  TSH 2.560    Disposition  Pt is being discharged home today in good condition.  Follow-up Plans & Appointments      Follow-up Information   Follow up with Donato SchultzSKAINS, MARK, MD. (Office will contact patient for follow up. Office will also contact patient for 30day event monitor. Please call if you do not hear from us in 2 days)    Specialty:  Cardiology   Contact information:   [address] [place] [id number] [phone]       Discharge Medications    Medication List           diltiazem 30 MG tablet  Commonly known as:  CARDIZEM  Take 1 tablet (30 mg total) by mouth 4 (four) times daily as needed (Tachycardia).        Outstanding Labs/Studies  30 day event monitor, office will call  Duration of Discharge Encounter   Greater than 30 minutes including physician time.  Ramond DialSigned, Meng, Hao PA-C 12/05/2013, 10:25 AM   Attending Note:   The patient was seen and examined.  Agree with assessment and plan as noted above.  Changes made to the above note as needed.  See my note from day of dc  Vesta MixerPhilip J. Nahser, Montez HagemanJr., MD, [facility] 12/06/2013, 2:02 PM [address],  [address] Office [phone]- [phone] Pager [phone]- [phone]

## 2013-12-05 NOTE — Progress Notes (Signed)
Patient Name: Laura [name] Date of Encounter: 12/05/2013     Active Problems:   PSVT (paroxysmal supraventricular tachycardia)   Family history of ischemic heart disease   Tobacco use    SUBJECTIVE  Denies any CP last night. No SOB. States several hours prior to her symptom onset, she spent 2 hours and did full detailing on the interior of her car. She states her symptom has been happening once every 2 weeks  CURRENT MEDS . aspirin  324 mg Oral NOW   Or  . aspirin  300 mg Rectal NOW  . aspirin EC  81 mg Oral Daily  . heparin  5,000 Units Subcutaneous 3 times per day  . nicotine  21 mg Transdermal Daily    OBJECTIVE  Filed Vitals:   12/04/13 0745 12/04/13 0909 12/04/13 2155 12/05/13 0547  BP: 93/60 97/58 92/47  92/56  Pulse: 63 60 72 61  Temp:  98.2 F (36.8 C) 98.4 F (36.9 C) 97.8 F (36.6 C)  TempSrc:  Oral Oral Oral  Resp: 20 18 18 18   Height:      Weight:    138 lb 12.8 oz (62.959 kg)  SpO2: 100% 99% 99% 99%   No intake or output data in the 24 hours ending 12/05/13 0806 Filed Weights   12/04/13 0238 12/05/13 0547  Weight: 143 lb (64.864 kg) 138 lb 12.8 oz (62.959 kg)    PHYSICAL EXAM  General: Pleasant, NAD. Neuro: Alert and oriented X 3. Moves all extremities spontaneously. Psych: Normal affect. HEENT:  Normal  Neck: Supple without bruits or JVD. Lungs:  Resp regular and unlabored, CTA. Heart: RRR no s3, s4, or murmurs. Abdomen: Soft, non-tender, non-distended, BS + x 4.  Extremities: No clubbing, cyanosis or edema. DP/PT/Radials 2+ and equal bilaterally.  Accessory Clinical Findings  CBC  Recent Labs  12/04/13 1200 12/05/13 0323  WBC 7.5 9.1  HGB 13.1 13.2  HCT 39.9 40.5  MCV 93.0 92.5  PLT 185 223   Basic Metabolic Panel  Recent Labs  12/04/13 0255 12/04/13 1200  NA 139  --   K 3.5*  --   CL 105  --   CO2 21  --   GLUCOSE 123*  --   BUN 22  --   CREATININE 0.73 0.68  CALCIUM 9.5  --    Cardiac Enzymes  Recent Labs  12/04/13 0255 12/04/13 0628  TROPONINI <0.30 <0.30   Fasting Lipid Panel  Recent Labs  12/05/13 0323  CHOL 122  HDL 40  LDLCALC 60  TRIG 109  CHOLHDL 3.1   Thyroid Function Tests  Recent Labs  12/04/13 1200  TSH 2.560    TELE  NSR with HR 50-60s, occasional 120s (which appeared sinus)  ECG  Cannot access EPIC EKG  Radiology/Studies  Dg Chest Port 1 View  12/04/2013   CLINICAL DATA:  PSVT.  EXAM: PORTABLE CHEST - 1 VIEW  COMPARISON:  07/16/2013  FINDINGS: Lungs are adequately inflated without consolidation or effusion. The cardiomediastinal silhouette, bones and soft tissues are within normal.  IMPRESSION: No active disease.   Electronically Signed   By: Elberta Fortis M.D.   On: 12/04/2013 11:07    ASSESSMENT AND PLAN  1. Tachycardia/PSVT/palpitation  - POC troponin 1.07, however serial troponin I negative  - TSH normal  - Echo 60-65%, normal wall motion, normal diastolic dysfunction  - nurse to ambulate patient this morning, if no CP, can potentially discharge  - will need 30 day event monitor  after discharge (symptom occur once every 2 weeks)  - relative hypotension does not allow addition of scheduled AV nodal blocking drug, will keep PRN 30mg  Diltiazem for symptom onset  - if symptom increase in frequency, can potentially consider outpt stress test later given her significant family history  2. Tobacco use 3. Strong family history of early MI (Father had MI in his 3740s, paternal GM died during sleep in her 2050s) 4. Relative hypotension  Leonides SchanzSigned, Azalee CourseMeng, Hao PA-C Pager: [phone]  Attending Note:   The patient was seen and examined.  Agree with assessment and plan as noted above.  Changes made to the above note as needed.  Patient presents with episodes of tachycardia and lightheadedness. She drinks sweet tea all day long, + [facility],  + eats M&Ms most of the day Her symptoms may be all due to caffeine + excess sugar.   We have discussed in great detail  that she needs to decrease her intake of sugar and caffiene. I have suggested V-8 juice or water with electrolyte tabs ( Nun for example)   I taught her the valsalva maneuver and the diving reflex to potentially stop the tachycardia if it does turn out to be SVT  I've recommended that she stop smoking Ok for DC today. Will need a monitor as OP Will give her Diltiazem 30 mg aPRN    Vesta MixerPhilip J. Nahser, Montez HagemanJr., MD, [facility] 12/05/2013, 9:15 AM [address],  [address] Office [phone] Pager [phone]

## 2013-12-06 ENCOUNTER — Encounter: Payer: Self-pay | Admitting: *Deleted

## 2013-12-06 NOTE — Progress Notes (Signed)
Patient ID: Laura [name], female   DOB: 12/17/1965, 48 y.o.   MRN: [id number] Patient enrolled for [employer] to mail a cardiac event monitor to her home pending approval of hardship program.  She is scheduled to have a monitor applied in the office on 12/19/2013.  [employer] has been e-mailed to question if this would be possible in lieu of mailing, if her hardship is approved.

## 2013-12-07 ENCOUNTER — Telehealth: Payer: Self-pay | Admitting: Cardiology

## 2013-12-07 NOTE — Telephone Encounter (Signed)
Yesterday about 2:30 pm she had a real bad headache.  States it was severe and lasted all day.  This AM when she woke it was gone.  She did stop caffeine recently.  She had an episode of racing heart after getting laundry out of the dryer.  She held her breath and it resolved.  She did take Diltiazem once yesterday.  Reviewed with her when she should take Diltiazem because she had questions.  She is scheduled for 30 event monitor and a f/u appt with Dr Anne Fu after.  Instructed her to call back if h/a returns but more than likely occurred r/t stopping caffeine.

## 2013-12-07 NOTE — Telephone Encounter (Signed)
New problem   Pt had headaches all day yesterday. This morning pt's heart stated racing. Pt would like to speak to nurse.

## 2013-12-19 ENCOUNTER — Encounter: Payer: Self-pay | Admitting: *Deleted

## 2013-12-19 ENCOUNTER — Encounter (INDEPENDENT_AMBULATORY_CARE_PROVIDER_SITE_OTHER): Payer: Self-pay

## 2013-12-19 DIAGNOSIS — R002 Palpitations: Secondary | ICD-10-CM

## 2013-12-19 NOTE — Progress Notes (Signed)
Patient ID: Laura [name], female   DOB: 1965/06/01, 48 y.o.   MRN: [id number] [employer] hardship approved.  [employer] 30 day cardiac event monitor applied to patient.

## 2014-01-21 ENCOUNTER — Ambulatory Visit: Payer: Self-pay | Admitting: Cardiology

## 2014-03-18 ENCOUNTER — Ambulatory Visit: Payer: Self-pay | Admitting: Cardiology

## 2014-04-03 ENCOUNTER — Encounter: Payer: Self-pay | Admitting: Cardiology

## 2015-01-06 ENCOUNTER — Emergency Department (HOSPITAL_COMMUNITY): Payer: Self-pay

## 2015-01-06 ENCOUNTER — Encounter (HOSPITAL_COMMUNITY): Payer: Self-pay | Admitting: Emergency Medicine

## 2015-01-06 ENCOUNTER — Emergency Department (HOSPITAL_COMMUNITY)
Admission: EM | Admit: 2015-01-06 | Discharge: 2015-01-06 | Disposition: A | Discharge status: Home / Self Care | Payer: Self-pay | Attending: Emergency Medicine | Admitting: Emergency Medicine

## 2015-01-06 DIAGNOSIS — J019 Acute sinusitis, unspecified: Secondary | ICD-10-CM | POA: Insufficient documentation

## 2015-01-06 DIAGNOSIS — R61 Generalized hyperhidrosis: Secondary | ICD-10-CM | POA: Insufficient documentation

## 2015-01-06 DIAGNOSIS — Z8701 Personal history of pneumonia (recurrent): Secondary | ICD-10-CM | POA: Insufficient documentation

## 2015-01-06 DIAGNOSIS — R059 Cough, unspecified: Secondary | ICD-10-CM

## 2015-01-06 DIAGNOSIS — R079 Chest pain, unspecified: Secondary | ICD-10-CM | POA: Insufficient documentation

## 2015-01-06 DIAGNOSIS — H9203 Otalgia, bilateral: Secondary | ICD-10-CM | POA: Insufficient documentation

## 2015-01-06 DIAGNOSIS — Z720 Tobacco use: Secondary | ICD-10-CM | POA: Insufficient documentation

## 2015-01-06 DIAGNOSIS — Z880 Allergy status to penicillin: Secondary | ICD-10-CM | POA: Insufficient documentation

## 2015-01-06 DIAGNOSIS — R05 Cough: Secondary | ICD-10-CM

## 2015-01-06 MED ORDER — ALBUTEROL SULFATE HFA 108 (90 BASE) MCG/ACT IN AERS
2.0000 | INHALATION_SPRAY | Freq: Once | RESPIRATORY_TRACT | Status: AC
  Administered 2015-01-06: 2 via RESPIRATORY_TRACT
  Filled 2015-01-06: qty 6.7

## 2015-01-06 MED ORDER — PREDNISONE 10 MG PO TABS: ORAL_TABLET | ORAL | Status: DC

## 2015-01-06 MED ORDER — GUAIFENESIN-CODEINE 100-10 MG/5ML PO SYRP: 10.0000 mL | ORAL_SOLUTION | Freq: Three times a day (TID) | ORAL | Status: DC | PRN

## 2015-01-06 NOTE — ED Provider Notes (Signed)
CSN: [id number]     Arrival date & time 01/06/15  1618 History  This chart was scribed for non-physician practitioner, Pauline Aus, PA-C working with Devoria Albe, MD, by Jarvis Morgan, ED Scribe. This patient was seen in room APFT23/APFT23 and the patient's care was started at 5:43 PM.     Chief Complaint  Patient presents with  . Nasal Congestion   The history is provided by the patient. No language interpreter was used.    HPI Comments: Laura [name] is a 49 y.o. female with a h/o pneumonia and bronchitis who presents to the Emergency Department complaining of intermittent, mild, productive, cough onset 10 days. She states the cough is productive of white and green sputum. She reports associated nasal congestion, sinus pressure, night sweats, chest tightness, wheezing and bilateral fullness in ears. Pt has taken OTC Mucinex and Alka Seltzer cold and sinus with no relief. Pt is a current everyday smoker with 0.5 ppd history. She endorses she has had to use an ihaler in the past for her bronchitis symptoms. She denies any fevers, chills, chest pain, shortness of breath, bloody sputum or sore throat.  Past Medical History  Diagnosis Date  . Pneumonia   . Otitis media 06/2013  . Bronchitis 06/2013   Past Surgical History  Procedure Laterality Date  . Cesarean section    . Tubal ligation     Family History  Problem Relation Age of Onset  . CAD Father    Social History  Substance Use Topics  . Smoking status: Current Every Day Smoker -- 0.50 packs/day    Types: Cigarettes  . Smokeless tobacco: Never Used  . Alcohol Use: No   OB History    No data available     Review of Systems  Constitutional: Negative for fever, chills, activity change and appetite change.  HENT: Positive for congestion, ear pain (bilateral fullness) and sinus pressure. Negative for sore throat.   Respiratory: Positive for cough, chest tightness and wheezing. Negative for shortness of breath.    Gastrointestinal: Negative for nausea, vomiting and abdominal pain.  Neurological: Negative for dizziness, syncope and headaches.      Allergies  Penicillins and Sulfa antibiotics  Home Medications   Prior to Admission medications   Medication Sig Start Date End Date Taking? Authorizing Provider  diltiazem (CARDIZEM) 30 MG tablet Take 1 tablet (30 mg total) by mouth 4 (four) times daily as needed (Tachycardia). 12/05/13   Azalee Course, PA   Triage Vitals: BP 110/62 mmHg  Pulse 84  Temp(Src) 97.6 F (36.4 C) (Oral)  Resp 16  Ht 5\' 9"  (1.753 m)  Wt 140 lb (63.504 kg)  BMI 20.67 kg/m2  SpO2 100%  LMP 01/02/2015  Physical Exam  Constitutional: She is oriented to person, place, and time. She appears well-developed and well-nourished. No distress.  HENT:  Head: Normocephalic and atraumatic.  Right Ear: Tympanic membrane and ear canal normal.  Left Ear: Tympanic membrane and ear canal normal.  Nose: Mucosal edema present. Right sinus exhibits maxillary sinus tenderness and frontal sinus tenderness. Left sinus exhibits maxillary sinus tenderness and frontal sinus tenderness.  Mouth/Throat: Uvula is midline and oropharynx is clear and moist.  Eyes: Conjunctivae and EOM are normal.  Neck: Normal range of motion. Neck supple. No tracheal deviation present.  Cardiovascular: Normal rate, regular rhythm and normal heart sounds.   Pulmonary/Chest: Effort normal. No respiratory distress. She has wheezes. She has no rales.  Coarse breath sounds bilaterally, mild inspiratory wheezing.  Musculoskeletal: Normal range of motion.  Neurological: She is alert and oriented to person, place, and time.  Skin: Skin is warm and dry.  Psychiatric: She has a normal mood and affect. Her behavior is normal.  Nursing note and vitals reviewed.   ED Course  Procedures (including critical care time)  DIAGNOSTIC STUDIES: Oxygen Saturation is 100% on RA, normal by my interpretation.    COORDINATION OF  CARE: 4:46 PM- Will order CXR. Pt advised of plan for treatment and pt agrees.  6:01 PM- Will d/c pt with prescription for cough syrup and steroid.  . Advised fluids and rest.  Pt advised of plan for treatment and pt agrees. Pt states that she is unsure if she has a PCN allergy, notes that she was told that by her mother, states she has taken amoxil before w/o incident  Labs Review Labs Reviewed - No data to display  Imaging Review Dg Chest 2 View  01/06/2015   CLINICAL DATA:  Ten day history of chest congestion with cough  EXAM: CHEST  2 VIEW  COMPARISON:  December 04, 2013  FINDINGS: Lungs are clear. Heart size and pulmonary vascularity are normal. No adenopathy. No bone lesions.  IMPRESSION: No edema or consolidation.   Electronically Signed   By: Bretta Bang III M.D.   On: 01/06/2015 17:09   I have personally reviewed and evaluated these images as part of my medical decision-making.   EKG Interpretation None      MDM   Final diagnoses:  Cough  Acute sinusitis, recurrence not specified, unspecified location    Pt is well appearing, non-toxic.  Vitals stable.  No tachycardia, hypoxia or tachypnea.  Dispensed albuterol inhaler.  Appears stable for d/c and agrees to close PMD f/u if needed.    I personally performed the services described in this documentation, which was scribed in my presence. The recorded information has been reviewed and is accurate.     Pauline Aus, PA-C 01/09/15 1610  Devoria Albe, MD 01/09/15 [phone]

## 2015-01-06 NOTE — ED Notes (Signed)
Head and chest congestion x 10 days, cough with green sputum, night sweats

## 2015-01-06 NOTE — Discharge Instructions (Signed)
Cough, Adult   A cough is a reflex. It helps you clear your throat and airways. A cough can help heal your body. A cough can last 2 or 3 weeks (acute) or may last more than 8 weeks (chronic). Some common causes of a cough can include an infection, allergy, or a cold.  HOME CARE  · Only take medicine as told by your doctor.  · If given, take your medicines (antibiotics) as told. Finish them even if you start to feel better.  · Use a cold steam vaporizer or humidifier in your home. This can help loosen thick spit (secretions).  · Sleep so you are almost sitting up (semi-upright). Use pillows to do this. This helps reduce coughing.  · Rest as needed.  · Stop smoking if you smoke.  GET HELP RIGHT AWAY IF:  · You have yellowish-white fluid (pus) in your thick spit.  · Your cough gets worse.  · Your medicine does not reduce coughing, and you are losing sleep.  · You cough up blood.  · You have trouble breathing.  · Your pain gets worse and medicine does not help.  · You have a fever.  MAKE SURE YOU:   · Understand these instructions.  · Will watch your condition.  · Will get help right away if you are not doing well or get worse.  Document Released: 12/24/2010 Document Revised: 08/27/2013 Document Reviewed: 12/24/2010  ExitCare® Patient Information ©2015 [employer]. This information is not intended to replace advice given to you by your health care provider. Make sure you discuss any questions you have with your health care provider.  Sinusitis  Sinusitis is redness, soreness, and puffiness (inflammation) of the air pockets in the bones of your face (sinuses). The redness, soreness, and puffiness can cause air and mucus to get trapped in your sinuses. This can allow germs to grow and cause an infection.   HOME CARE   · Drink enough fluids to keep your pee (urine) clear or pale yellow.  · Use a humidifier in your home.  · Run a hot shower to create steam in the bathroom. Sit in the bathroom with the door closed. Breathe  in the steam 3-4 times a day.  · Put a warm, moist washcloth on your face 3-4 times a day, or as told by your doctor.  · Use salt water sprays (saline sprays) to wet the thick fluid in your nose. This can help the sinuses drain.  · Only take medicine as told by your doctor.  GET HELP RIGHT AWAY IF:   · Your pain gets worse.  · You have very bad headaches.  · You are sick to your stomach (nauseous).  · You throw up (vomit).  · You are very sleepy (drowsy) all the time.  · Your face is puffy (swollen).  · Your vision changes.  · You have a stiff neck.  · You have trouble breathing.  MAKE SURE YOU:   · Understand these instructions.  · Will watch your condition.  · Will get help right away if you are not doing well or get worse.  Document Released: 09/29/2007 Document Revised: 01/05/2012 Document Reviewed: 11/16/2011  ExitCare® Patient Information ©2015 [employer]. This information is not intended to replace advice given to you by your health care provider. Make sure you discuss any questions you have with your health care provider.

## 2015-07-24 ENCOUNTER — Encounter (HOSPITAL_COMMUNITY): Payer: Self-pay | Admitting: Emergency Medicine

## 2015-07-24 ENCOUNTER — Emergency Department (HOSPITAL_COMMUNITY)
Admission: EM | Admit: 2015-07-24 | Discharge: 2015-07-24 | Disposition: A | Discharge status: Home / Self Care | Payer: Self-pay | Attending: Emergency Medicine | Admitting: Emergency Medicine

## 2015-07-24 DIAGNOSIS — F17210 Nicotine dependence, cigarettes, uncomplicated: Secondary | ICD-10-CM | POA: Insufficient documentation

## 2015-07-24 DIAGNOSIS — J111 Influenza due to unidentified influenza virus with other respiratory manifestations: Secondary | ICD-10-CM | POA: Insufficient documentation

## 2015-07-24 MED ORDER — PROMETHAZINE HCL 25 MG PO TABS: 25.0000 mg | ORAL_TABLET | Freq: Four times a day (QID) | ORAL | Status: DC | PRN

## 2015-07-24 MED ORDER — OSELTAMIVIR PHOSPHATE 75 MG PO CAPS: 75.0000 mg | ORAL_CAPSULE | Freq: Two times a day (BID) | ORAL | Status: DC

## 2015-07-24 MED ORDER — IBUPROFEN 800 MG PO TABS
800.0000 mg | ORAL_TABLET | Freq: Once | ORAL | Status: AC
  Administered 2015-07-24: 800 mg via ORAL
  Filled 2015-07-24: qty 1

## 2015-07-24 MED ORDER — ACETAMINOPHEN 325 MG PO TABS
650.0000 mg | ORAL_TABLET | Freq: Once | ORAL | Status: AC
  Administered 2015-07-24: 650 mg via ORAL
  Filled 2015-07-24: qty 2

## 2015-07-24 MED ORDER — PROMETHAZINE HCL 12.5 MG PO TABS
25.0000 mg | ORAL_TABLET | Freq: Once | ORAL | Status: AC
  Administered 2015-07-24: 25 mg via ORAL
  Filled 2015-07-24: qty 2

## 2015-07-24 NOTE — ED Provider Notes (Signed)
CSN: [id number]     Arrival date & time 07/24/15  1354 History   First MD Initiated Contact with Patient 07/24/15 1544     Chief Complaint  Patient presents with  . Generalized Body Aches     (Consider location/radiation/quality/duration/timing/severity/associated sxs/prior Treatment) Patient is a 50 y.o. female presenting with URI. The history is provided by the patient.  URI Presenting symptoms: cough, fever and sore throat   Severity:  Moderate Onset quality:  Gradual Duration:  2 days Timing:  Intermittent Progression:  Worsening Chronicity:  New Relieved by:  Nothing Worsened by:  Nothing tried Associated symptoms: arthralgias, headaches and myalgias   Risk factors: no chronic cardiac disease, no chronic kidney disease, no immunosuppression, no recent travel and no sick contacts     Past Medical History  Diagnosis Date  . Pneumonia   . Otitis media 06/2013  . Bronchitis 06/2013   Past Surgical History  Procedure Laterality Date  . Cesarean section    . Tubal ligation     Family History  Problem Relation Age of Onset  . CAD Father    Social History  Substance Use Topics  . Smoking status: Current Every Day Smoker -- 0.50 packs/day    Types: Cigarettes  . Smokeless tobacco: Never Used  . Alcohol Use: No   OB History    No data available     Review of Systems  Constitutional: Positive for fever.  HENT: Positive for sore throat.   Respiratory: Positive for cough.   Musculoskeletal: Positive for myalgias and arthralgias.  Neurological: Positive for headaches.  All other systems reviewed and are negative.     Allergies  Penicillins and Sulfa antibiotics  Home Medications   Prior to Admission medications   Medication Sig Start Date End Date Taking? Authorizing Provider  diltiazem (CARDIZEM) 30 MG tablet Take 1 tablet (30 mg total) by mouth 4 (four) times daily as needed (Tachycardia). 12/05/13   Azalee CourseHao Meng, PA  guaiFENesin-codeine (ROBITUSSIN AC) 100-10  MG/5ML syrup Take 10 mLs by mouth 3 (three) times daily as needed. Patient not taking: Reported on 07/24/2015 01/06/15   Tammy Triplett, PA-C  predniSONE (DELTASONE) 10 MG tablet Take 6 tablets day one, 5 tablets day two, 4 tablets day three, 3 tablets day four, 2 tablets day five, then 1 tablet day six Patient not taking: Reported on 07/24/2015 01/06/15   Tammy Triplett, PA-C   BP 104/63 mmHg  Pulse 96  Temp(Src) 100.2 F (37.9 C) (Oral)  Resp 16  Ht 5\' 9"  (1.753 m)  Wt 67.132 kg  BMI 21.85 kg/m2  SpO2 97%  LMP 07/03/2015 Physical Exam  Constitutional: She is oriented to person, place, and time. She appears well-developed and well-nourished.  Non-toxic appearance.  HENT:  Head: Normocephalic.  Right Ear: Tympanic membrane and external ear normal.  Left Ear: Tympanic membrane and external ear normal.  Eyes: EOM and lids are normal. Pupils are equal, round, and reactive to light.  Neck: Normal range of motion. Neck supple. Carotid bruit is not present.  Cardiovascular: Normal rate, regular rhythm, normal heart sounds, intact distal pulses and normal pulses.   Pulmonary/Chest: Breath sounds normal. No respiratory distress.  Abdominal: Soft. Bowel sounds are normal. There is no tenderness. There is no guarding.  Musculoskeletal: Normal range of motion.  Soreness with range of motion of the upper and lower extremities. No hot joints appreciated.  Lymphadenopathy:       Head (right side): No submandibular adenopathy present.  Head (left side): No submandibular adenopathy present.    She has no cervical adenopathy.  Neurological: She is alert and oriented to person, place, and time. She has normal strength. No cranial nerve deficit or sensory deficit.  Skin: Skin is warm and dry.  Psychiatric: She has a normal mood and affect. Her speech is normal.  Nursing note and vitals reviewed.   ED Course  Procedures (including critical care time) Labs Review Labs Reviewed - No data to  display  Imaging Review No results found. I have personally reviewed and evaluated these images and lab results as part of my medical decision-making.   EKG Interpretation None      MDM  Vital signs reviewed. The examination favors influenza. The patient will be treated with Tylenol and ibuprofen. Patient has had some nausea, and promethazine will be ordered. Patient also given a prescription for Tamiflu. Discussed with the patient and her husband the importance of good handwashing, as well as good hydration. Patient is in agreement with this discharge plan.    Final diagnoses:  None    **I have reviewed nursing notes, vital signs, and all appropriate lab and imaging results for this patient.Ivery Quale, PA-C 07/24/15 1703  Marily Memos, MD 07/28/15 [id number]

## 2015-07-24 NOTE — ED Notes (Signed)
Pt with body aches, chills and fever, decrease appetite. Denies taking ANYTHING at home for symptoms

## 2015-07-24 NOTE — Discharge Instructions (Signed)
Your examination is consistent with influenza. Please wash hands frequently. Usually mask until symptoms have resolved. Please use Tylenol every 4 hours, and/or ibuprofen every 6. Please use Tamiflu 2 times daily until all taken. Use promethazine for nausea. Promethazine may cause drowsiness, please use with caution. Influenza, Adult Influenza (flu) is an infection in the mouth, nose, and throat (respiratory tract) caused by a virus. The flu can make you feel very ill. Influenza spreads easily from person to person (contagious).  HOME CARE   Only take medicines as told by your doctor.  Use a cool mist humidifier to make breathing easier.  Get plenty of rest until your fever goes away. This usually takes 3 to 4 days.  Drink enough fluids to keep your pee (urine) clear or pale yellow.  Cover your mouth and nose when you cough or sneeze.  Wash your hands well to avoid spreading the flu.  Stay home from work or school until your fever has been gone for at least 1 full day.  Get a flu shot every year. GET HELP RIGHT AWAY IF:   You have trouble breathing or feel short of breath.  Your skin or nails turn blue.  You have severe neck pain or stiffness.  You have a severe headache, facial pain, or earache.  Your fever gets worse or keeps coming back.  You feel sick to your stomach (nauseous), throw up (vomit), or have watery poop (diarrhea).  You have chest pain.  You have a deep cough that gets worse, or you cough up more thick spit (mucus). MAKE SURE YOU:   Understand these instructions.  Will watch your condition.  Will get help right away if you are not doing well or get worse.   This information is not intended to replace advice given to you by your health care provider. Make sure you discuss any questions you have with your health care provider.   Document Released: 01/20/2008 Document Revised: 05/03/2014 Document Reviewed: 07/12/2011 Elsevier Interactive Patient Education  [employer]2016 [employer] Inc.

## 2015-07-24 NOTE — ED Notes (Signed)
Pt reports generalized body aches, dry cough x3 days. Pt denies fever,v/d.

## 2015-12-19 ENCOUNTER — Emergency Department (HOSPITAL_COMMUNITY)
Admission: EM | Admit: 2015-12-19 | Discharge: 2015-12-19 | Disposition: A | Discharge status: Home / Self Care | Payer: Self-pay | Attending: Emergency Medicine | Admitting: Emergency Medicine

## 2015-12-19 ENCOUNTER — Encounter (HOSPITAL_COMMUNITY): Payer: Self-pay | Admitting: Emergency Medicine

## 2015-12-19 DIAGNOSIS — F17210 Nicotine dependence, cigarettes, uncomplicated: Secondary | ICD-10-CM | POA: Insufficient documentation

## 2015-12-19 DIAGNOSIS — J069 Acute upper respiratory infection, unspecified: Secondary | ICD-10-CM | POA: Insufficient documentation

## 2015-12-19 MED ORDER — DOXYCYCLINE HYCLATE 100 MG PO TABS: 100.0000 mg | ORAL_TABLET | Freq: Two times a day (BID) | ORAL | 0 refills | Status: DC

## 2015-12-19 NOTE — ED Triage Notes (Signed)
PT c/o nasal congestion x1 week  with green productive sputum cough. PT states she had a steroid injection at urgent care last week but wasn't able to fill the antibiotics that was prescribed to her.

## 2015-12-19 NOTE — Discharge Instructions (Signed)
Please continue to use your nasal spray. You may also use over the counter saline spray or neti pot to help alleviate congestion. Take doxycyline 1 tablet every 12 hours for 5 days. Please call your primary care doctor to schedule a follow up appointment. Thank you!

## 2015-12-19 NOTE — ED Provider Notes (Signed)
[facility]-EMERGENCY DEPT Provider Note   CSN: [id number] Arrival date & time: 12/19/15  1628     History   Chief Complaint Chief Complaint  Patient presents with  . Cough    HPI Laura [name] is a 50 y.o. female.  Patient presents today with complaint of cough and congestion for 9 days. Patient has a history of recurrent sinusitis requiring antibiotics 2-3 times a year and a history of PNA. Patient was seen by urgent care on Thursday and given a steroid shot and a prescription for an antibiotic. Per patient, her pharmacy refused to fill it. Patient has a documented allergy to penicillin. She is unsure which antibiotic she was prescribed. She denies chest pain/SOB. No fevers or sore throat. She endorses sinus pressure, rhinorrhea, and a generalized HA. Cough is non productive. She has been using Astelin nasal spray with minimal relief.       Past Medical History:  Diagnosis Date  . Bronchitis 06/2013  . Otitis media 06/2013  . Pneumonia     Patient Active Problem List   Diagnosis Date Noted  . PSVT (paroxysmal supraventricular tachycardia) (HCC) 12/04/2013  . Family history of ischemic heart disease 12/04/2013  . Tobacco use 12/04/2013    Past Surgical History:  Procedure Laterality Date  . CESAREAN SECTION    . TUBAL LIGATION      OB History    Gravida Para Term Preterm AB Living             2   SAB TAB Ectopic Multiple Live Births                   Home Medications    Prior to Admission medications   Medication Sig Start Date End Date Taking? Authorizing Provider  diltiazem (CARDIZEM) 30 MG tablet Take 1 tablet (30 mg total) by mouth 4 (four) times daily as needed (Tachycardia). 12/05/13   Azalee Course, PA  doxycycline (VIBRA-TABS) 100 MG tablet Take 1 tablet (100 mg total) by mouth every 12 (twelve) hours. 12/19/15   Reymundo Poll, MD  guaiFENesin-codeine [facility]) 100-10 MG/5ML syrup Take 10 mLs by mouth 3 (three) times daily as needed. Patient not taking:  Reported on 07/24/2015 01/06/15   Tammy Triplett, PA-C  oseltamivir (TAMIFLU) 75 MG capsule Take 1 capsule (75 mg total) by mouth every 12 (twelve) hours. 07/24/15   Ivery Quale, PA-C  predniSONE (DELTASONE) 10 MG tablet Take 6 tablets day one, 5 tablets day two, 4 tablets day three, 3 tablets day four, 2 tablets day five, then 1 tablet day six Patient not taking: Reported on 07/24/2015 01/06/15   Tammy Triplett, PA-C  promethazine (PHENERGAN) 25 MG tablet Take 1 tablet (25 mg total) by mouth every 6 (six) hours as needed for nausea or vomiting. 07/24/15   Ivery Quale, PA-C    Family History Family History  Problem Relation Age of Onset  . CAD Father     Social History Social History  Substance Use Topics  . Smoking status: Current Every Day Smoker    Packs/day: 0.50    Types: Cigarettes  . Smokeless tobacco: Never Used  . Alcohol use No     Allergies   Penicillins and Sulfa antibiotics   Review of Systems Review of Systems  Constitutional: Negative.   HENT: Positive for congestion, rhinorrhea and sinus pressure. Negative for sore throat.   Eyes: Negative.   Respiratory: Positive for cough. Negative for chest tightness, shortness of breath and wheezing.   Cardiovascular:  Negative.  Negative for chest pain.  Gastrointestinal: Negative.   Endocrine: Negative.   Genitourinary: Negative.   Musculoskeletal: Negative.   Skin: Negative.   Allergic/Immunologic: Negative.   Neurological: Positive for headaches.  Hematological: Negative.   Psychiatric/Behavioral: Negative.      Physical Exam Updated Vital Signs BP 115/64 (BP Location: Left Arm)   Pulse 60   Temp 98 F (36.7 C) (Oral)   Resp 16   Ht 5\' 9"  (1.753 m)   Wt 64 kg   SpO2 100%   BMI 20.82 kg/m   Physical Exam  Constitutional: She appears well-developed and well-nourished. No distress.  HENT:  Head: Normocephalic and atraumatic.  Maxillary and frontal sinuses tender to palpation  Eyes: Conjunctivae are  normal.  Neck: Neck supple.  Cardiovascular: Normal rate and regular rhythm.   No murmur heard. Pulmonary/Chest: Effort normal and breath sounds normal. No respiratory distress.  Abdominal: Soft. There is no tenderness.  Musculoskeletal: She exhibits no edema.  Neurological: She is alert.  Skin: Skin is warm and dry.  Psychiatric: She has a normal mood and affect.  Nursing note and vitals reviewed.    ED Treatments / Results  Labs (all labs ordered are listed, but only abnormal results are displayed) Labs Reviewed - No data to display  EKG  EKG Interpretation None       Radiology No results found.  Procedures Procedures (including critical care time)  Medications Ordered in ED Medications - No data to display   Initial Impression / Assessment and Plan / ED Course  I have reviewed the triage vital signs and the nursing notes.  Pertinent labs & imaging results that were available during my care of the patient were reviewed by me and considered in my medical decision making (see chart for details).  Clinical Course   Acute sinusitis: Symptoms of cough and congestion for 9 days. Afebrile, lungs clear to ascultation. Discussed with patient that etiology is likely viral, however, given her history of recurrent sinus infections requiring antibiotics, patient was given a prescription for doxycycline 100mg  BID for 5 days (penicllin allergy and azithromycin has not worked for patient in past). Instructed to fill in a couple of days if symptoms do not improve. Encouraged patient to use saline washes with a neti pot and to continue using astelin nasal spray. F/u with PCP.  Final Clinical Impressions(s) / ED Diagnoses   Final diagnoses:  Acute upper respiratory infection    New Prescriptions Discharge Medication List as of 12/19/2015  5:09 PM    START taking these medications   Details  doxycycline (VIBRA-TABS) 100 MG tablet Take 1 tablet (100 mg total) by mouth every 12  (twelve) hours., Starting Fri 12/19/2015, Print         Reymundo Pollarolyn Blia Totman, MD 12/21/15 1657    Blane OharaJoshua Zavitz, MD 12/23/15 [phone]

## 2015-12-23 ENCOUNTER — Other Ambulatory Visit (HOSPITAL_COMMUNITY): Payer: Self-pay | Admitting: *Deleted

## 2015-12-23 DIAGNOSIS — N6002 Solitary cyst of left breast: Secondary | ICD-10-CM

## 2015-12-24 ENCOUNTER — Other Ambulatory Visit (HOSPITAL_COMMUNITY): Payer: Self-pay | Admitting: *Deleted

## 2015-12-24 DIAGNOSIS — N6002 Solitary cyst of left breast: Secondary | ICD-10-CM

## 2015-12-25 ENCOUNTER — Other Ambulatory Visit (HOSPITAL_COMMUNITY): Payer: Self-pay | Admitting: *Deleted

## 2015-12-25 DIAGNOSIS — N6002 Solitary cyst of left breast: Secondary | ICD-10-CM

## 2015-12-30 ENCOUNTER — Ambulatory Visit (HOSPITAL_COMMUNITY)
Admission: RE | Admit: 2015-12-30 | Discharge: 2015-12-30 | Disposition: A | Discharge status: Home / Self Care | Payer: Self-pay | Source: Ambulatory Visit | Attending: *Deleted | Admitting: *Deleted

## 2015-12-30 ENCOUNTER — Inpatient Hospital Stay (HOSPITAL_COMMUNITY): Admission: RE | Admit: 2015-12-30 | Payer: Self-pay | Source: Ambulatory Visit

## 2015-12-30 DIAGNOSIS — N6002 Solitary cyst of left breast: Secondary | ICD-10-CM | POA: Insufficient documentation

## 2016-03-09 ENCOUNTER — Other Ambulatory Visit (HOSPITAL_COMMUNITY)
Admission: RE | Admit: 2016-03-09 | Discharge: 2016-03-09 | Disposition: A | Discharge status: Home / Self Care | Payer: Medicaid Other | Source: Ambulatory Visit | Attending: Unknown Physician Specialty | Admitting: Unknown Physician Specialty

## 2016-03-09 DIAGNOSIS — R87612 Low grade squamous intraepithelial lesion on cytologic smear of cervix (LGSIL): Secondary | ICD-10-CM | POA: Diagnosis present

## 2016-03-09 DIAGNOSIS — N871 Moderate cervical dysplasia: Secondary | ICD-10-CM | POA: Diagnosis not present

## 2016-03-31 ENCOUNTER — Encounter: Payer: Self-pay | Admitting: *Deleted

## 2016-04-08 ENCOUNTER — Ambulatory Visit (INDEPENDENT_AMBULATORY_CARE_PROVIDER_SITE_OTHER): Payer: Medicaid Other | Admitting: Obstetrics and Gynecology

## 2016-04-08 ENCOUNTER — Encounter: Payer: Self-pay | Admitting: Obstetrics and Gynecology

## 2016-04-08 ENCOUNTER — Encounter (INDEPENDENT_AMBULATORY_CARE_PROVIDER_SITE_OTHER): Payer: Self-pay

## 2016-04-08 VITALS — BP 100/60 | HR 62 | Ht 68.0 in | Wt 146.5 lb

## 2016-04-08 DIAGNOSIS — N871 Moderate cervical dysplasia: Secondary | ICD-10-CM

## 2016-04-08 DIAGNOSIS — Z1159 Encounter for screening for other viral diseases: Secondary | ICD-10-CM

## 2016-04-08 DIAGNOSIS — N946 Dysmenorrhea, unspecified: Secondary | ICD-10-CM | POA: Diagnosis not present

## 2016-04-08 DIAGNOSIS — N920 Excessive and frequent menstruation with regular cycle: Secondary | ICD-10-CM

## 2016-04-08 DIAGNOSIS — Z118 Encounter for screening for other infectious and parasitic diseases: Principal | ICD-10-CM

## 2016-04-08 NOTE — Progress Notes (Signed)
Patient ID: Laura Chadresa A Usrey, female   DOB: 06/14/1965, 50 y.o.   MRN: [id number]    [facility]  @DATE @            Patient name: Laura [name] MRN [id number]  Date of birth: 08/29/1965  CC & HPI:   Chief Complaint  Patient presents with  . talk to Dr. Emelda FearFerguson    [facility]     Laura Chadresa A Johannes is a 50 y.o. female [facility] 03/09/16 revealing CIN-II involving endocervix and cervix:   1. Endocervix, curettage - HIGH GRADE SQUAMOUS INTRAEPITHELIAL LESION, CIN-II (MODERATE DYSPLASIA). - BENIGN ENDOCERVICAL MUCOSA. 2. Cervix, biopsy, @ 9:00 - LOW GRADE SQUAMOUS INTRAEPITHELIAL LESION, CIN-I (MILD DYSPLASIA). 3. Cervix, biopsy, @ 5 and between 1 & 2 o'clock - HIGH GRADE SQUAMOUS INTRAEPITHELIAL LESION, CIN-II (MODERATE DYSPLASIA). 4. Cervix, polyp - ENDOCERVICAL POLYP WITH FOCAL LOW GRADE SQUAMOUS INTRAEPITHELIAL LESION, CIN-I (MILD DYSPLASIA).  Pt states she still has periods and experiences moderate to severe premenstrual discomfort for 2-3 days and lasts through menses. She reports her periods last 7-8 days and cause her to routinely miss work 1-2 days per month d/t the pain.   No h/o heart disease or complications of anesthesia. Pt denies any recent cough, congestion or fever. She is a current smoker, 1ppd. Pt states she has attempted to quit smoking using patches; however, she finds that she still smokes while using them. She does note that she smokes fewer cigarettes while using patches.   ROS:  ROS  No complaints, referral for abnormal pap and colpo   Pertinent History Reviewed:   Reviewed: Significant for cesarean section, tubal ligation  Medical         Past Medical History:  Diagnosis Date  . Benign breast cyst in female   . Bronchitis 06/2013  . Otitis media 06/2013  . Pneumonia                               Surgical Hx:    Past Surgical History:  Procedure Laterality Date  . BREAST SURGERY Right  2005   mass removed  . CESAREAN SECTION  [id number]  . TUBAL LIGATION     Medications: Reviewed & Updated - see associated section                      No current outpatient prescriptions on file.   Social History: Reviewed -  reports that she has been smoking Cigarettes.  She has a 15.00 pack-year smoking history. She has never used smokeless tobacco.  Objective Findings:  Vitals: Blood pressure 100/60, pulse 62, height 5\' 8"  (1.727 m), weight 146 lb 8 oz (66.5 kg), last menstrual period 03/11/2016.  Physical Examination: General appearance - alert, well appearing, and in no distress Mental status - alert, oriented to person, place, and time Chest - clear to auscultation, no wheezes, rales or rhonchi, symmetric air entry Heart - normal rate, regular rhythm, normal S1, S2, no murmurs, rubs, clicks or gallops Abdomen - soft, nontender, nondistended, no masses or organomegaly Pelvic -  VULVA: normal appearing vulva with no masses, tenderness or lesions,  VAGINA: normal appearing vagina with normal color and discharge, no lesions,  CERVIX: normal appearing cervix without discharge or lesions, well supported, appears normal  UTERUS: uterus is normal size, shape, consistency and nontender,  ADNEXA: normal adnexa in size, nontender  and no masses  GC/CHL collected  Discussion: 1. Discussed with pt risks and benefits of cervical conization. Discussed details of procedure with pt. Pt counseled on the progression of cervical abnormalities.   2. Pt expresses interest in hysterectomy as a treatment option due to moderate to severe menstrual and premenstrual discomfort in addition to her cervical abnormality.   3. Discussed bilateral salpingectomy as part of abdominal hysterectomy procedure. Advised pt that bilateral salpingectomy reduces the risk of cancer by 2/3.  At end of discussion, pt had opportunity to ask questions and has no further questions at this time. Pt requests being considered  for abdominal hysterectomy with bilateral salpingectomy and removal of cervix instead of conization for treatment of abnormality due to associated dysmenorrhea and heavy periods.   Specific discussion of conization vs abdominal hysterectomy as noted above. Greater than 50% was spent in counseling and coordination of care with the patient.   Total time greater than: 25 minutes.   Assessment & Plan:   A:  1. CIN-II abnormality involving endocervix and cervix   P:  1. Obtain medicaid approval, request hysterectomy in lieu of conization d/t pts associated dysmenorrhea and heavy periods 2. F/u in 4 weeks or early  04/2016 after approval clears for procedure    By signing my name below, I, Doreatha MartinEva Mathews, attest that this documentation has been prepared under the direction and in the presence of Tilda BurrowJohn V Lottie Siska, MD. Electronically Signed: Doreatha MartinEva Mathews, ED Scribe. 04/08/16. 2:56 PM.  I personally performed the services described in this documentation, which was SCRIBED in my presence. The recorded information has been reviewed and considered accurate. It has been edited as necessary during review. Tilda BurrowFERGUSON,Dacian Orrico V, MD

## 2016-04-10 LAB — GC/CHLAMYDIA PROBE AMP
Chlamydia trachomatis, NAA: NEGATIVE
NEISSERIA GONORRHOEAE BY PCR: NEGATIVE

## 2016-04-22 ENCOUNTER — Telehealth: Payer: Self-pay | Admitting: Obstetrics and Gynecology

## 2016-04-22 NOTE — Telephone Encounter (Signed)
Pt called stating that she would like to speak with Dr. Emelda FearFerguson Nurse, Pt did not state the reason why. Please contact pt

## 2016-04-23 NOTE — Telephone Encounter (Signed)
Patient called wanting to know if her next appointment was to schedule her surgery. I informed her that her next appointment she was scheduled as a pre-op and her surgery would be scheduled then. I also made her aware that he would do a full head to toe assessment as well. Pt verbalized understanding. No further questions.

## 2016-05-06 ENCOUNTER — Encounter: Payer: Self-pay | Admitting: Obstetrics and Gynecology

## 2016-05-06 ENCOUNTER — Ambulatory Visit (INDEPENDENT_AMBULATORY_CARE_PROVIDER_SITE_OTHER): Payer: Medicaid Other | Admitting: Obstetrics and Gynecology

## 2016-05-06 VITALS — Ht 68.9 in | Wt 152.4 lb

## 2016-05-06 DIAGNOSIS — N920 Excessive and frequent menstruation with regular cycle: Secondary | ICD-10-CM | POA: Diagnosis not present

## 2016-05-06 DIAGNOSIS — R102 Pelvic and perineal pain: Secondary | ICD-10-CM | POA: Diagnosis not present

## 2016-05-06 DIAGNOSIS — N871 Moderate cervical dysplasia: Secondary | ICD-10-CM | POA: Diagnosis not present

## 2016-05-06 DIAGNOSIS — Z118 Encounter for screening for other infectious and parasitic diseases: Principal | ICD-10-CM

## 2016-05-06 DIAGNOSIS — N946 Dysmenorrhea, unspecified: Secondary | ICD-10-CM | POA: Diagnosis not present

## 2016-05-06 DIAGNOSIS — M545 Low back pain: Secondary | ICD-10-CM

## 2016-05-06 DIAGNOSIS — Z1159 Encounter for screening for other viral diseases: Secondary | ICD-10-CM

## 2016-05-06 NOTE — Progress Notes (Signed)
Patient ID: Laura [name], female   DOB: 1965/09/01, 51 y.o.   MRN: [id number] Preoperative History and Physical  Laura [name] is a 51 y.o. [id number] here for surgical management of CIN-II involving endocervix and cervix, dysmenorrhea, and heavy periods. No significant preoperative concerns. Pt is having associated symptoms of lower back pain and pelvic pain x 3 days before and 5-6 days after menstrual cycle. Pt states that she has 7 days of menstrual bleeding. Pt hasn't tried any medications for the relief of her symptoms. Pt denies any other symptoms. Pt notes that she has had past C-sections.  The pateint has declined consideration of conization of cervix, an alternative tx, due to the bleeding period concernss. Proposed surgery: abdominal hysterectomy with bilateral salpingectomy and removal of cervix.   Past Medical History:  Diagnosis Date  . Benign breast cyst in female   . Bronchitis 06/2013  . Otitis media 06/2013  . Pneumonia    Past Surgical History:  Procedure Laterality Date  . BREAST SURGERY Right 2005   mass removed  . CESAREAN SECTION  [id number]  . TUBAL LIGATION     OB History  Gravida Para Term Preterm AB Living  2 2 2     2   SAB TAB Ectopic Multiple Live Births          2    # Outcome Date GA Lbr Len/2nd Weight Sex Delivery Anes PTL Lv  2 Term 07/04/87 [id number]   F CS-LTranv   LIV  1 Term 11/17/83 [redacted]w[redacted]d   M CS-LTranv   LIV    Patient denies any other pertinent gynecologic issues.   No current outpatient prescriptions on file prior to visit.   No current facility-administered medications on file prior to visit.    Allergies  Allergen Reactions  . Penicillins Shortness Of Breath    Has patient had a PCN reaction causing immediate rash, facial/tongue/throat swelling, SOB or lightheadedness with hypotension: Yes Has patient had a PCN reaction causing severe rash involving mucus membranes or skin necrosis: No Has patient had a PCN reaction that required hospitalization  No Has patient had a PCN reaction occurring within the last 10 years: No If all of the above answers are "NO", then may proceed with Cephalosporin use.   . Sulfa Antibiotics Other (See Comments)    blisters    Social History:   reports that she has been smoking Cigarettes.  She has a 15.00 pack-year smoking history. She has never used smokeless tobacco. She reports that she does not drink alcohol or use drugs.  Family History  Problem Relation Age of Onset  . CAD Father   . Lung cancer Paternal Grandfather     bone cancer  . Heart disease Paternal Grandmother   . Stomach cancer Maternal Grandmother   . Tremor Brother   . Parkinson's disease Brother   . Other Daughter     breast mass  . Asthma Son   . Other Son     Lyme's    Review of Systems: Noncontributory  PHYSICAL EXAM: Height 5' 8.9" (1.75 m), weight 152 lb 6.4 oz (69.1 kg), last menstrual period 04/27/2016. General appearance - alert, well appearing, and in no distress Chest - clear to auscultation, no wheezes, rales or rhonchi, symmetric air entry Heart - normal rate and regular rhythm Abdomen - soft, nontender, nondistended, no masses or organomegaly. Well healed C-section surgical scar. Pelvic - normal external genitalia, vulva, vagina, cervix, uterus and adnexa,  VULVA: normal appearing vulva  with no masses, tenderness or lesions,  VAGINA: normal appearing vagina with normal color and discharge, no lesions,  CERVIX: normal appearing cervix without discharge or lesions,  UTERUS: uterus is normal size, shape, consistency and nontender,  ADNEXA: normal adnexa in size, nontender and no masses,  Extremities - peripheral pulses normal, no pedal edema, no clubbing or cyanosis  Labs: No results found for this or any previous visit (from the past 336 hour(s)).  Imaging Studies: No results found.  Assessment: CIN-II involving endocervix and cervix Dysmenorrhea Peri-menstrual pain Patient Active Problem List    Diagnosis Date Noted  . PSVT (paroxysmal supraventricular tachycardia) (HCC) 12/04/2013  . Family history of ischemic heart disease 12/04/2013  . Tobacco use 12/04/2013    Plan: Patient will undergo surgical management with abdominal hysterectomy with bilateral salpingectomy and removal of cervix.    Tilda BurrowJohn V Julissa Browning, MD 05/06/2016 9:34 AM   By signing my name below, I, Soijett Blue, attest that this documentation has been prepared under the direction and in the presence of Tilda BurrowJohn V Takerra Lupinacci, MD. Electronically Signed: Soijett Blue, ED Scribe. 05/06/16. 9:36 AM.  I personally performed the services described in this documentation, which was SCRIBED in my presence. The recorded information has been reviewed and considered accurate. It has been edited as necessary during review. Tilda BurrowFERGUSON,Yolando Gillum V, MD

## 2016-05-08 LAB — GC/CHLAMYDIA PROBE AMP
Chlamydia trachomatis, NAA: NEGATIVE
Neisseria gonorrhoeae by PCR: NEGATIVE

## 2016-05-12 NOTE — Patient Instructions (Signed)
Laura [name]  05/12/2016     @PREFPERIOPPHARMACY @   Your procedure is scheduled on  05/18/2016   Report to [facility] at  615  A.M.  Call this number if you have problems the morning of surgery:  [phone]   Remember:  Do not eat food or drink liquids after midnight.  Take these medicines the morning of surgery with A SIP OF WATER  none   Do not wear jewelry, make-up or nail polish.  Do not wear lotions, powders, or perfumes, or deoderant.  Do not shave 48 hours prior to surgery.  Men may shave face and neck.  Do not bring valuables to the hospital.  [facility] is not responsible for any belongings or valuables.  Contacts, dentures or bridgework may not be worn into surgery.  Leave your suitcase in the car.  After surgery it may be brought to your room.  For patients admitted to the hospital, discharge time will be determined by your treatment team.  Patients discharged the day of surgery will not be allowed to drive home.   Name and phone number of your driver:   family Special instructions:  Follow the diet and prep instructions given to you by Dr Emelda Fear.  Please read over the following fact sheets that you were given. Anesthesia Post-op Instructions and Care and Recovery After Surgery       Abdominal Hysterectomy Abdominal hysterectomy is a surgery to remove your womb (uterus). Your womb is the part of your body that contains a growing baby. The surgery may be done for many reasons. These may include cancer, growths (tumors), long-term pain, or bleeding. You may also need other reproductive parts removed during this surgery. This will depend on why you need to have the surgery. What happens before the procedure?  Talk to your doctor about the changes to your body. These changes may be physical and emotional.  You may need to have blood work done. You may also need X-rays done.  Quit smoking if you smoke. Ask your doctor for help.  Stop  taking medicines that thin your blood as told by your doctor.  Your doctor may have you take other medicines. Take all medicines as told by your doctor.  Do not eat or drink anything for 6-8 hours before surgery.  Take your normal medicines with a small sip of water.  Shower or take a bath the night or morning before surgery. What happens during the procedure?  This surgery is done in the hospital.  You are given a medicine that makes you go to sleep (general anesthetic).  The doctor will make a cut (incision) through the skin in your lower belly.  The cut may be about 5-7 inches long. It may go side-to-side or up-and-down.  The doctor will move the body tissue that covers your womb. The doctor will carefully remove your womb. The doctor may remove any other reproductive parts that need to be removed.  The doctor will use clamps or stitches (sutures) to control bleeding.  The doctor will close your cut with stitches or metal clips. What happens after the procedure?  You will have pain right after the procedure.  You will be given pain medicine in the recovery room.  You will be taken to your hospital room after the medicines that made you go to sleep wear off.  You will be told how to take care of yourself at  home. This information is not intended to replace advice given to you by your health care provider. Make sure you discuss any questions you have with your health care provider. Document Released: 04/17/2013 Document Revised: 11/19/2015 Document Reviewed: 02/02/2013 Elsevier Interactive Patient Education  2017 [employer]     Abdominal Hysterectomy, Care After These instructions give you information on caring for yourself after your procedure. Your doctor may also give you more specific instructions. Call your doctor if you have any problems or questions after your procedure. Follow these instructions at home: It takes 4-6 weeks to recover from this surgery. Follow  all of your doctor's instructions.  Only take medicines as told by your doctor.  Change your bandage as told by your doctor.  Return to your doctor to have your stitches taken out.  Take showers for 2-3 weeks. Ask your doctor when it is okay to shower.  Do not douche, use tampons, or have sex (intercourse) for at least 6 weeks or as told.  Follow your doctor's advice about exercise, lifting objects, driving, and general activities.  Get plenty of rest and sleep.  Do not lift anything heavier than a gallon of milk (about 10 pounds [4.5 kilograms]) for the first month after surgery.  Get back to your normal diet as told by your doctor.  Do not drink alcohol until your doctor says it is okay.  Take a medicine to help you poop (laxative) as told by your doctor.  Eating foods high in fiber may help you poop. Eat a lot of raw fruits and vegetables, whole grains, and beans.  Drink enough fluids to keep your pee (urine) clear or pale yellow.  Have someone help you at home for 1-2 weeks after your surgery.  Keep follow-up doctor visits as told. Contact a doctor if:  You have chills or fever.  You have puffiness, redness, or pain in area of the cut (incision).  You have yellowish-white fluid (pus) coming from the cut.  You have a bad smell coming from the cut or bandage.  Your cut pulls apart.  You feel dizzy or light-headed.  You have pain or bleeding when you pee.  You keep having watery poop (diarrhea).  You keep feeling sick to your stomach (nauseous) or keep throwing up (vomiting).  You have fluid (discharge) coming from your vagina.  You have a rash.  You have a reaction to your medicine.  You need stronger pain medicine. Get help right away if:  You have a fever and your symptoms suddenly get worse.  You have bad belly (abdominal) pain.  You have chest pain.  You are short of breath.  You pass out (faint).  You have pain, puffiness, or redness of  your leg.  You bleed a lot from your vagina and notice clumps of tissue (clots). This information is not intended to replace advice given to you by your health care provider. Make sure you discuss any questions you have with your health care provider. Document Released: 01/20/2008 Document Revised: 09/18/2015 Document Reviewed: 02/02/2013 Elsevier Interactive Patient Education  2017 [employer]   Salpingectomy Salpingectomy, also called tubectomy, is the surgical removal of one of the fallopian tubes. The fallopian tubes are where eggs travel from the ovaries to the uterus. Removing one fallopian tube does not prevent you from becoming pregnant. It also does not cause problems with your menstrual periods. You may need a salpingectomy if you:  Have a fertilized egg that attaches to the fallopian tube (  ectopic pregnancy), especially one that causes the tube to burst or tear (rupture).  Have an infected fallopian tube.  Have cancer of the fallopian tube or nearby organs.  Have had an ovary removed due to a cyst or tumor.  Have had your uterus removed. There are three different methods that can be used for a salpingectomy:  Open. This method involves making one large incision in your abdomen.  Laparoscopic. This method involves using a thin, lighted tube with a tiny camera on the end (laparoscope) to help perform the procedure. The laparoscope will allow your surgeon to make several small incisions in the abdomen instead of a large incision.  Robot-assisted: This method involves using a computer to control surgical instruments that are attached to robotic arms. Tell a health care provider about:  Any allergies you have.  All medicines you are taking, including vitamins, herbs, eye drops, creams, and over-the-counter medicines.  Any problems you or family members have had with anesthetic medicines.  Any blood disorders you have.  Any surgeries you have had.  Any medical  conditions you have.  Whether you are pregnant or may be pregnant. What are the risks? Generally, this is a safe procedure. However, problems may occur, including:  Infection.  Bleeding.  Allergic reactions to medicines.  Damage to other structures or organs.  Blood clots in the legs or lungs. What happens before the procedure? Staying hydrated  Follow instructions from your health care provider about hydration, which may include:  Up to 2 hours before the procedure - you may continue to drink clear liquids, such as water, clear fruit juice, black coffee, and plain tea. Eating and drinking restrictions  Follow instructions from your health care provider about eating and drinking, which may include:  8 hours before the procedure - stop eating heavy meals or foods such as meat, fried foods, or fatty foods.  6 hours before the procedure - stop eating light meals or foods, such as toast or cereal.  6 hours before the procedure - stop drinking milk or drinks that contain milk.  2 hours before the procedure - stop drinking clear liquids. Medicines  Ask your health care provider about:  Changing or stopping your regular medicines. This is especially important if you are taking diabetes medicines or blood thinners.  Taking medicines such as aspirin and ibuprofen. These medicines can thin your blood. Do not take these medicines before your procedure if your health care provider instructs you not to.  You may be given antibiotic medicine to help prevent infection. General instructions  Do not smoke for at least 2 weeks before your procedure. If you need help quitting, ask your health care provider.  You may have an exam or tests, such as an electrocardiogram (ECG).  You may have a blood or urine sample taken.  Ask your health care provider:  Whether you should stop removing hair from your surgical area.  How your surgical site will be marked or identified.  You may be asked  to shower with a germ-killing soap.  Plan to have someone take you home from the hospital or clinic.  If you will be going home right after the procedure, plan to have someone with you for 24 hours. What happens during the procedure?  To reduce your risk of infection:  Your health care team will wash or sanitize their hands.  Hair may be removed from the surgical area.  Your skin will be washed with soap.  An IV tube  will be inserted into one of your veins.  You will be given a medicine to make you fall asleep (general anesthetic). You may also be given a medicine to help you relax (sedative).  A thin tube (catheter) may be inserted through your urethra and into your bladder to drain urine during your procedure.  Depending on the type of procedure you are having, one incision or several small incisions will be made in your abdomen.  Your fallopian tube will be cut and removed from where it attaches to your uterus.  Your blood vessels will be clamped and tied to prevent excess bleeding.  The incision(s) in your abdomen will be closed with stitches (sutures), staples, or skin glue.  A bandage (dressing) may be placed over your incision(s). The procedure may vary among health care providers and hospitals. What happens after the procedure?  Your blood pressure, heart rate, breathing rate, and blood oxygen level will be monitored until the medicines you were given have worn off.  You may continue to receive fluids and medicines through an IV tube.  You may continue to have a catheter draining your urine.  You may have to wear compression stockings. These stockings help to prevent blood clots and reduce swelling in your legs.  You will be given pain medicine as needed.  Do not drive for 24 hours if you received a sedative. Summary  Salpingectomy is a surgical procedure to remove one of the fallopian tubes.  The procedure may be done with an open incision, with a laparoscope,  or with computer-controlled instruments.  Depending on the type of procedure you are having, one incision or several small incisions will be made in your abdomen.  Your blood pressure, heart rate, breathing rate, and blood oxygen level will be monitored until the medicines you were given have worn off.  Plan to have someone take you home from the hospital or clinic. This information is not intended to replace advice given to you by your health care provider. Make sure you discuss any questions you have with your health care provider. Document Released: 08/29/2008 Document Revised: 11/28/2015 Document Reviewed: 10/04/2012 Elsevier Interactive Patient Education  2017 [employer] Inc.  Salpingectomy, Care After This sheet gives you information about how to care for yourself after your procedure. Your health care provider may also give you more specific instructions. If you have problems or questions, contact your health care provider. What can I expect after the procedure? After your procedure, it is common to have:  Pain in your abdomen.  Some occasional vaginal bleeding (spotting).  Tiredness. Follow these instructions at home: Incision care  Keep your incision area and your bandage (dressing) clean and dry.  Follow instructions from your health care provider about how to take care of your incision. Make sure you:  Wash your hands with soap and water before you change your dressing. If soap and water are not available, use hand sanitizer.  Change your dressing astold by your health care provider.  Leave stitches (sutures), staples, skin glue, or adhesive strips in place. These skin closures may need to stay in place for 2 weeks or longer. If adhesive strip edges start to loosen and curl up, you may trim the loose edges. Do not remove adhesive strips completely unless your health care provider tells you to do that.  Check your incision area every day for signs of infection. Check  for:  More redness, swelling, or pain.  More fluid or blood.  Warmth.  Pus  or a bad smell. Activity  Do not drive or use heavy machinery while taking prescription pain medicine.  Do not drive for 24 hours if you received a medicine to help you relax (sedative).  Rest as directed by your health care provider. Ask your health care provider what activities are safe for you. You should avoid:  Lifting anything that is heavier than 10 lb (4.5 kg) until your health care provider approves.  Activities that require a lot of energy.  Until your health care provider approves:  Do not douche.  Do not use tampons.  Do not have sexual intercourse. General instructions  Take over-the-counter and prescription medicines only as told by your health care provider.  To prevent or treat constipation while you are taking prescription pain medicine, your health care provider may recommend that you:  Drink enough fluid to keep your urine clear or pale yellow.  Take over-the-counter or prescription medicines.  Eat foods that are high in fiber, such as fresh fruits and vegetables, whole grains, and beans.  Limit foods that are high in fat and processed sugars, such as fried and sweet foods.  Do not take baths, swim, or use a hot tub until your health care provider approves. You may take showers.  Wear compression stockings as told by your health care provider. These stockings help to prevent blood clots and reduce swelling in your legs.  Keep all follow-up visits as told by your health care provider. This is important. Contact a health care provider if:  You have:  Pain when you urinate.  More redness, swelling, or pain around your incision.  More fluid or blood coming from your incision.  Pus or a bad smell coming from your incision.  A fever.  Abdominal pain that gets worse or does not get better with medicine.  Your incision feels warm to the touch.  Your incision starts to  break open.  You develop a rash.  You develop nausea and vomiting.  You feel light-headed. Get help right away if:  You develop pain in your chest or leg.  You develop shortness of breath.  You faint.  You have increased vaginal bleeding. This information is not intended to replace advice given to you by your health care provider. Make sure you discuss any questions you have with your health care provider. Document Released: 07/17/2010 Document Revised: 12/10/2015 Document Reviewed: 12/11/2015 Elsevier Interactive Patient Education  2017 [employer] Inc.  General Anesthesia, Adult General anesthesia is the use of medicines to make a person "go to sleep" (be unconscious) for a medical procedure. General anesthesia is often recommended when a procedure:  Is long.  Requires you to be still or in an unusual position.  Is major and can cause you to lose blood.  Is impossible to do without general anesthesia. The medicines used for general anesthesia are called general anesthetics. In addition to making you sleep, the medicines:  Prevent pain.  Control your blood pressure.  Relax your muscles. Tell a health care provider about:  Any allergies you have.  All medicines you are taking, including vitamins, herbs, eye drops, creams, and over-the-counter medicines.  Any problems you or family members have had with anesthetic medicines.  Types of anesthetics you have had in the past.  Any bleeding disorders you have.  Any surgeries you have had.  Any medical conditions you have.  Any history of heart or lung conditions, such as heart failure, sleep apnea, or chronic obstructive pulmonary disease (COPD).  Whether you are pregnant or may be pregnant.  Whether you use tobacco, alcohol, marijuana, or street drugs.  Any history of Financial [employer].  Any history of depression or anxiety. What are the risks? Generally, this is a safe procedure. However, problems may occur,  including:  Allergic reaction to anesthetics.  Lung and heart problems.  Inhaling food or liquids from your stomach into your lungs (aspiration).  Injury to nerves.  Waking up during your procedure and being unable to move (rare).  Extreme agitation or a state of mental confusion (delirium) when you wake up from the anesthetic.  Air in the bloodstream, which can lead to stroke. These problems are more likely to develop if you are having a major surgery or if you have an advanced medical condition. You can prevent some of these complications by answering all of your health care provider's questions thoroughly and by following all pre-procedure instructions. General anesthesia can cause side effects, including:  Nausea or vomiting  A sore throat from the breathing tube.  Feeling cold or shivery.  Feeling tired, washed out, or achy.  Sleepiness or drowsiness.  Confusion or agitation. What happens before the procedure? Staying hydrated  Follow instructions from your health care provider about hydration, which may include:  Up to 2 hours before the procedure - you may continue to drink clear liquids, such as water, clear fruit juice, black coffee, and plain tea. Eating and drinking restrictions  Follow instructions from your health care provider about eating and drinking, which may include:  8 hours before the procedure - stop eating heavy meals or foods such as meat, fried foods, or fatty foods.  6 hours before the procedure - stop eating light meals or foods, such as toast or cereal.  6 hours before the procedure - stop drinking milk or drinks that contain milk.  2 hours before the procedure - stop drinking clear liquids. Medicines  Ask your health care provider about:  Changing or stopping your regular medicines. This is especially important if you are taking diabetes medicines or blood thinners.  Taking medicines such as aspirin and ibuprofen. These medicines can thin  your blood. Do not take these medicines before your procedure if your health care provider instructs you not to.  Taking new dietary supplements or medicines. Do not take these during the week before your procedure unless your health care provider approves them.  If you are told to take a medicine or to continue taking a medicine on the day of the procedure, take the medicine with sips of water. General instructions   Ask if you will be going home the same day, the following day, or after a longer hospital stay.  Plan to have someone take you home.  Plan to have someone stay with you for the first 24 hours after you leave the hospital or clinic.  For 3-6 weeks before the procedure, try not to use any tobacco products, such as cigarettes, chewing tobacco, and e-cigarettes.  You may brush your teeth on the morning of the procedure, but make sure to spit out the toothpaste. What happens during the procedure?  You will be given anesthetics through a mask and through an IV tube in one of your veins.  You may receive medicine to help you relax (sedative).  As soon as you are asleep, a breathing tube may be used to help you breathe.  An anesthesia specialist will stay with you throughout the procedure. He or she will help keep you  comfortable and safe by continuing to give you medicines and adjusting the amount of medicine that you get. He or she will also watch your blood pressure, pulse, and oxygen levels to make sure that the anesthetics do not cause any problems.  If a breathing tube was used to help you breathe, it will be removed before you wake up. The procedure may vary among health care providers and hospitals. What happens after the procedure?  You will wake up, often slowly, after the procedure is complete, usually in a recovery area.  Your blood pressure, heart rate, breathing rate, and blood oxygen level will be monitored until the medicines you were given have worn off.  You  may be given medicine to help you calm down if you feel anxious or agitated.  If you will be going home the same day, your health care provider may check to make sure you can stand, drink, and urinate.  Your health care providers will treat your pain and side effects before you go home.  Do not drive for 24 hours if you received a sedative.  You may:  Feel nauseous and vomit.  Have a sore throat.  Have mental slowness.  Feel cold or shivery.  Feel sleepy.  Feel tired.  Feel sore or achy, even in parts of your body where you did not have surgery. This information is not intended to replace advice given to you by your health care provider. Make sure you discuss any questions you have with your health care provider. Document Released: 07/20/2007 Document Revised: 09/23/2015 Document Reviewed: 03/27/2015 Elsevier Interactive Patient Education  2017 [employer] General Anesthesia, Adult, Care After These instructions provide you with information about caring for yourself after your procedure. Your health care provider may also give you more specific instructions. Your treatment has been planned according to current medical practices, but problems sometimes occur. Call your health care provider if you have any problems or questions after your procedure. What can I expect after the procedure? After the procedure, it is common to have:  Vomiting.  A sore throat.  Mental slowness. It is common to feel:  Nauseous.  Cold or shivery.  Sleepy.  Tired.  Sore or achy, even in parts of your body where you did not have surgery. Follow these instructions at home: For at least 24 hours after the procedure:  Do not:  Participate in activities where you could fall or become injured.  Drive.  Use heavy machinery.  Drink alcohol.  Take sleeping pills or medicines that cause drowsiness.  Make important decisions or sign legal documents.  Take care of children on your  own.  Rest. Eating and drinking  If you vomit, drink water, juice, or soup when you can drink without vomiting.  Drink enough fluid to keep your urine clear or pale yellow.  Make sure you have little or no nausea before eating solid foods.  Follow the diet recommended by your health care provider. General instructions  Have a responsible adult stay with you until you are awake and alert.  Return to your normal activities as told by your health care provider. Ask your health care provider what activities are safe for you.  Take over-the-counter and prescription medicines only as told by your health care provider.  If you smoke, do not smoke without supervision.  Keep all follow-up visits as told by your health care provider. This is important. Contact a health care provider if:  You continue to have nausea or  vomiting at home, and medicines are not helpful.  You cannot drink fluids or start eating again.  You cannot urinate after 8-12 hours.  You develop a skin rash.  You have fever.  You have increasing redness at the site of your procedure. Get help right away if:  You have difficulty breathing.  You have chest pain.  You have unexpected bleeding.  You feel that you are having a life-threatening or urgent problem. This information is not intended to replace advice given to you by your health care provider. Make sure you discuss any questions you have with your health care provider. Document Released: 07/19/2000 Document Revised: 09/15/2015 Document Reviewed: 03/27/2015 Elsevier Interactive Patient Education  2017 [employer].

## 2016-05-13 ENCOUNTER — Other Ambulatory Visit: Payer: Self-pay | Admitting: Obstetrics and Gynecology

## 2016-05-14 ENCOUNTER — Encounter (HOSPITAL_COMMUNITY)
Admission: RE | Admit: 2016-05-14 | Discharge: 2016-05-14 | Disposition: A | Discharge status: Home / Self Care | Payer: Medicaid Other | Source: Ambulatory Visit | Attending: Obstetrics and Gynecology | Admitting: Obstetrics and Gynecology

## 2016-05-14 ENCOUNTER — Encounter (HOSPITAL_COMMUNITY): Payer: Self-pay

## 2016-05-14 DIAGNOSIS — I471 Supraventricular tachycardia: Secondary | ICD-10-CM | POA: Diagnosis not present

## 2016-05-14 DIAGNOSIS — Z01812 Encounter for preprocedural laboratory examination: Secondary | ICD-10-CM | POA: Diagnosis present

## 2016-05-14 DIAGNOSIS — Z8249 Family history of ischemic heart disease and other diseases of the circulatory system: Secondary | ICD-10-CM | POA: Diagnosis not present

## 2016-05-14 DIAGNOSIS — Z720 Tobacco use: Secondary | ICD-10-CM | POA: Insufficient documentation

## 2016-05-14 HISTORY — DX: Anxiety disorder, unspecified: F41.9

## 2016-05-14 LAB — COMPREHENSIVE METABOLIC PANEL
ALBUMIN: 4.1 g/dL (ref 3.5–5.0)
ALT: 21 U/L (ref 14–54)
AST: 36 U/L (ref 15–41)
Alkaline Phosphatase: 68 U/L (ref 38–126)
Anion gap: 5 (ref 5–15)
BILIRUBIN TOTAL: 0.4 mg/dL (ref 0.3–1.2)
BUN: 17 mg/dL (ref 6–20)
CO2: 28 mmol/L (ref 22–32)
CREATININE: 0.66 mg/dL (ref 0.44–1.00)
Calcium: 9.2 mg/dL (ref 8.9–10.3)
Chloride: 104 mmol/L (ref 101–111)
GFR calc Af Amer: >60 mL/min (ref >60)
GFR calc non Af Amer: >60 mL/min (ref >60)
GLUCOSE: 89 mg/dL (ref 65–99)
POTASSIUM: 4.5 mmol/L (ref 3.5–5.1)
Sodium: 137 mmol/L (ref 135–145)
Total Protein: 7 g/dL (ref 6.5–8.1)

## 2016-05-14 LAB — CBC
HCT: 43.1 % (ref 36.0–46.0)
HEMOGLOBIN: 14.2 g/dL (ref 12.0–15.0)
MCH: 30.5 pg (ref 26.0–34.0)
MCHC: 32.9 g/dL (ref 30.0–36.0)
MCV: 92.5 fL (ref 78.0–100.0)
Platelets: 239 10*3/uL (ref 150–400)
RBC: 4.66 MIL/uL (ref 3.87–5.11)
RDW: 14.4 % (ref 11.5–15.5)
WBC: 9.1 10*3/uL (ref 4.0–10.5)

## 2016-05-15 LAB — HCG, SERUM, QUALITATIVE: PREG SERUM: NEGATIVE

## 2016-05-18 ENCOUNTER — Inpatient Hospital Stay (HOSPITAL_COMMUNITY): Payer: Medicaid Other | Admitting: Anesthesiology

## 2016-05-18 ENCOUNTER — Encounter (HOSPITAL_COMMUNITY): Payer: Self-pay | Admitting: *Deleted

## 2016-05-18 ENCOUNTER — Inpatient Hospital Stay (HOSPITAL_COMMUNITY)
Admission: RE | Admit: 2016-05-18 | Discharge: 2016-05-20 | DRG: 743 | Disposition: A | Discharge status: Home / Self Care | Payer: Medicaid Other | Source: Ambulatory Visit | Attending: Obstetrics and Gynecology | Admitting: Obstetrics and Gynecology

## 2016-05-18 ENCOUNTER — Other Ambulatory Visit: Payer: Self-pay | Admitting: Obstetrics and Gynecology

## 2016-05-18 ENCOUNTER — Encounter (HOSPITAL_COMMUNITY)
Admission: RE | Disposition: A | Discharge status: Home / Self Care | Payer: Self-pay | Source: Ambulatory Visit | Attending: Obstetrics and Gynecology

## 2016-05-18 DIAGNOSIS — Z808 Family history of malignant neoplasm of other organs or systems: Secondary | ICD-10-CM

## 2016-05-18 DIAGNOSIS — Z800 Family history of malignant neoplasm of digestive organs: Secondary | ICD-10-CM | POA: Diagnosis not present

## 2016-05-18 DIAGNOSIS — Z820 Family history of epilepsy and other diseases of the nervous system: Secondary | ICD-10-CM | POA: Diagnosis not present

## 2016-05-18 DIAGNOSIS — Z825 Family history of asthma and other chronic lower respiratory diseases: Secondary | ICD-10-CM | POA: Diagnosis not present

## 2016-05-18 DIAGNOSIS — Z801 Family history of malignant neoplasm of trachea, bronchus and lung: Secondary | ICD-10-CM | POA: Diagnosis not present

## 2016-05-18 DIAGNOSIS — Z8249 Family history of ischemic heart disease and other diseases of the circulatory system: Secondary | ICD-10-CM | POA: Diagnosis not present

## 2016-05-18 DIAGNOSIS — N946 Dysmenorrhea, unspecified: Secondary | ICD-10-CM | POA: Diagnosis present

## 2016-05-18 DIAGNOSIS — N920 Excessive and frequent menstruation with regular cycle: Secondary | ICD-10-CM | POA: Diagnosis present

## 2016-05-18 DIAGNOSIS — N871 Moderate cervical dysplasia: Secondary | ICD-10-CM | POA: Diagnosis present

## 2016-05-18 DIAGNOSIS — N840 Polyp of corpus uteri: Secondary | ICD-10-CM | POA: Diagnosis not present

## 2016-05-18 DIAGNOSIS — Z90710 Acquired absence of both cervix and uterus: Secondary | ICD-10-CM | POA: Diagnosis present

## 2016-05-18 DIAGNOSIS — N800 Endometriosis of uterus: Secondary | ICD-10-CM

## 2016-05-18 HISTORY — PX: ABDOMINAL HYSTERECTOMY: SHX81

## 2016-05-18 HISTORY — PX: BILATERAL SALPINGECTOMY: SHX5743

## 2016-05-18 LAB — TYPE AND SCREEN
ABO/RH(D): O POS
Antibody Screen: NEGATIVE

## 2016-05-18 SURGERY — HYSTERECTOMY, ABDOMINAL
Anesthesia: General | Site: Abdomen

## 2016-05-18 MED ORDER — GENTAMICIN SULFATE 40 MG/ML IJ SOLN
INTRAMUSCULAR | Status: AC
  Filled 2016-05-18: qty 10

## 2016-05-18 MED ORDER — HYDROMORPHONE HCL 1 MG/ML IJ SOLN
INTRAMUSCULAR | Status: AC
  Filled 2016-05-18: qty 0.5

## 2016-05-18 MED ORDER — NEOSTIGMINE METHYLSULFATE 10 MG/10ML IV SOLN
INTRAVENOUS | Status: DC | PRN
  Administered 2016-05-18: 4 mg via INTRAVENOUS

## 2016-05-18 MED ORDER — PROPOFOL 10 MG/ML IV BOLUS
INTRAVENOUS | Status: DC | PRN
Start: 2016-05-18 — End: 2016-05-18
  Administered 2016-05-18: 50 mg via INTRAVENOUS
  Administered 2016-05-18: 100 mg via INTRAVENOUS

## 2016-05-18 MED ORDER — EPHEDRINE SULFATE 50 MG/ML IJ SOLN
INTRAMUSCULAR | Status: AC
  Filled 2016-05-18: qty 1

## 2016-05-18 MED ORDER — HYDROMORPHONE 1 MG/ML IV SOLN
INTRAVENOUS | Status: DC
  Administered 2016-05-18: 12:00:00 via INTRAVENOUS

## 2016-05-18 MED ORDER — HYDROMORPHONE 1 MG/ML IV SOLN
INTRAVENOUS | Status: AC
Start: 2016-05-18 — End: 2016-05-18
  Filled 2016-05-18: qty 25

## 2016-05-18 MED ORDER — ONDANSETRON HCL 4 MG/2ML IJ SOLN
INTRAMUSCULAR | Status: AC
  Filled 2016-05-18: qty 2

## 2016-05-18 MED ORDER — HYDROMORPHONE HCL 1 MG/ML IJ SOLN
0.2500 mg | INTRAMUSCULAR | Status: DC | PRN
  Administered 2016-05-18 (×4): 0.5 mg via INTRAVENOUS
  Filled 2016-05-18 (×3): qty 0.5

## 2016-05-18 MED ORDER — GLYCOPYRROLATE 0.2 MG/ML IJ SOLN
INTRAMUSCULAR | Status: DC | PRN
  Administered 2016-05-18: 0.6 mg via INTRAVENOUS

## 2016-05-18 MED ORDER — PRENATAL MULTIVITAMIN CH
1.0000 | ORAL_TABLET | Freq: Every day | ORAL | Status: DC
  Administered 2016-05-19 – 2016-05-20 (×2): 1 via ORAL
  Filled 2016-05-18 (×4): qty 1

## 2016-05-18 MED ORDER — MIDAZOLAM HCL 2 MG/2ML IJ SOLN
0.5000 mg | INTRAMUSCULAR | Status: DC | PRN
  Administered 2016-05-18 (×2): 2 mg via INTRAVENOUS

## 2016-05-18 MED ORDER — ROCURONIUM BROMIDE 100 MG/10ML IV SOLN
INTRAVENOUS | Status: DC | PRN
  Administered 2016-05-18: 40 mg via INTRAVENOUS

## 2016-05-18 MED ORDER — DIPHENHYDRAMINE HCL 50 MG/ML IJ SOLN
12.5000 mg | Freq: Four times a day (QID) | INTRAMUSCULAR | Status: DC | PRN
  Administered 2016-05-18: 12.5 mg via INTRAVENOUS
  Filled 2016-05-18: qty 1

## 2016-05-18 MED ORDER — PROPOFOL 10 MG/ML IV BOLUS
INTRAVENOUS | Status: AC
  Filled 2016-05-18: qty 40

## 2016-05-18 MED ORDER — LIDOCAINE HCL (PF) 1 % IJ SOLN
INTRAMUSCULAR | Status: AC
  Filled 2016-05-18: qty 5

## 2016-05-18 MED ORDER — EPHEDRINE SULFATE 50 MG/ML IJ SOLN
INTRAMUSCULAR | Status: DC | PRN
  Administered 2016-05-18: 10 mg via INTRAVENOUS

## 2016-05-18 MED ORDER — ROCURONIUM BROMIDE 50 MG/5ML IV SOLN
INTRAVENOUS | Status: AC
  Filled 2016-05-18: qty 1

## 2016-05-18 MED ORDER — BUPIVACAINE-EPINEPHRINE (PF) 0.25% -1:200000 IJ SOLN
INTRAMUSCULAR | Status: AC
  Filled 2016-05-18: qty 30

## 2016-05-18 MED ORDER — GLYCOPYRROLATE 0.2 MG/ML IJ SOLN
INTRAMUSCULAR | Status: AC
Start: 2016-05-18 — End: 2016-05-18
  Filled 2016-05-18: qty 3

## 2016-05-18 MED ORDER — SODIUM CHLORIDE 0.9 % IJ SOLN
INTRAMUSCULAR | Status: AC
Start: 2016-05-18 — End: 2016-05-18
  Filled 2016-05-18: qty 10

## 2016-05-18 MED ORDER — DIPHENHYDRAMINE HCL 12.5 MG/5ML PO ELIX: 12.5000 mg | ORAL_SOLUTION | Freq: Four times a day (QID) | ORAL | Status: DC | PRN

## 2016-05-18 MED ORDER — FENTANYL CITRATE (PF) 100 MCG/2ML IJ SOLN
INTRAMUSCULAR | Status: DC | PRN
  Administered 2016-05-18 (×6): 50 ug via INTRAVENOUS

## 2016-05-18 MED ORDER — MIDAZOLAM HCL 2 MG/2ML IJ SOLN
INTRAMUSCULAR | Status: AC
Start: 2016-05-18 — End: 2016-05-18
  Filled 2016-05-18: qty 2

## 2016-05-18 MED ORDER — LIDOCAINE HCL (CARDIAC) 20 MG/ML IV SOLN
INTRAVENOUS | Status: DC | PRN
  Administered 2016-05-18: 50 mg via INTRAVENOUS

## 2016-05-18 MED ORDER — SODIUM CHLORIDE 0.9% FLUSH: 9.0000 mL | INTRAVENOUS | Status: DC | PRN

## 2016-05-18 MED ORDER — SUCCINYLCHOLINE CHLORIDE 20 MG/ML IJ SOLN
INTRAMUSCULAR | Status: AC
  Filled 2016-05-18: qty 1

## 2016-05-18 MED ORDER — HYDROMORPHONE HCL 1 MG/ML IJ SOLN
INTRAMUSCULAR | Status: AC
Start: 2016-05-18 — End: 2016-05-18
  Filled 2016-05-18: qty 0.5

## 2016-05-18 MED ORDER — HYDROMORPHONE HCL 1 MG/ML IJ SOLN
0.5000 mg | INTRAMUSCULAR | Status: DC | PRN
  Administered 2016-05-18 (×2): 0.5 mg via INTRAVENOUS
  Filled 2016-05-18: qty 0.5

## 2016-05-18 MED ORDER — ONDANSETRON HCL 4 MG/2ML IJ SOLN
4.0000 mg | Freq: Once | INTRAMUSCULAR | Status: AC
  Administered 2016-05-18: 4 mg via INTRAVENOUS

## 2016-05-18 MED ORDER — ONDANSETRON HCL 4 MG/2ML IJ SOLN
4.0000 mg | Freq: Four times a day (QID) | INTRAMUSCULAR | Status: DC | PRN
  Administered 2016-05-18: 4 mg via INTRAVENOUS

## 2016-05-18 MED ORDER — PROMETHAZINE HCL 25 MG/ML IJ SOLN
12.5000 mg | INTRAMUSCULAR | Status: DC | PRN
  Administered 2016-05-18: 12.5 mg via INTRAVENOUS
  Filled 2016-05-18: qty 1

## 2016-05-18 MED ORDER — FENTANYL CITRATE (PF) 250 MCG/5ML IJ SOLN
INTRAMUSCULAR | Status: AC
  Filled 2016-05-18: qty 5

## 2016-05-18 MED ORDER — FENTANYL CITRATE (PF) 100 MCG/2ML IJ SOLN
INTRAMUSCULAR | Status: AC
  Filled 2016-05-18: qty 2

## 2016-05-18 MED ORDER — 0.9 % SODIUM CHLORIDE (POUR BTL) OPTIME
TOPICAL | Status: DC | PRN
  Administered 2016-05-18: 2000 mL

## 2016-05-18 MED ORDER — ARTIFICIAL TEARS OP OINT
TOPICAL_OINTMENT | OPHTHALMIC | Status: AC
  Filled 2016-05-18: qty 3.5

## 2016-05-18 MED ORDER — NEOSTIGMINE METHYLSULFATE 10 MG/10ML IV SOLN
INTRAVENOUS | Status: AC
  Filled 2016-05-18: qty 1

## 2016-05-18 MED ORDER — LACTATED RINGERS IV SOLN
INTRAVENOUS | Status: DC
  Administered 2016-05-18: 1000 mL via INTRAVENOUS
  Administered 2016-05-18 (×2): via INTRAVENOUS

## 2016-05-18 MED ORDER — BUPIVACAINE-EPINEPHRINE (PF) 0.25% -1:200000 IJ SOLN
INTRAMUSCULAR | Status: DC | PRN
  Administered 2016-05-18: 16 mL via PERINEURAL

## 2016-05-18 MED ORDER — FENTANYL CITRATE (PF) 100 MCG/2ML IJ SOLN
50.0000 ug | INTRAMUSCULAR | Status: DC | PRN
  Administered 2016-05-18 – 2016-05-19 (×3): 50 ug via INTRAVENOUS
  Filled 2016-05-18 (×4): qty 2

## 2016-05-18 MED ORDER — NALOXONE HCL 0.4 MG/ML IJ SOLN: 0.4000 mg | INTRAMUSCULAR | Status: DC | PRN

## 2016-05-18 MED ORDER — KETOROLAC TROMETHAMINE 30 MG/ML IJ SOLN
30.0000 mg | Freq: Four times a day (QID) | INTRAMUSCULAR | Status: DC
  Administered 2016-05-18 – 2016-05-19 (×3): 30 mg via INTRAVENOUS
  Filled 2016-05-18 (×4): qty 1

## 2016-05-18 MED ORDER — MIDAZOLAM HCL 2 MG/2ML IJ SOLN
INTRAMUSCULAR | Status: AC
  Filled 2016-05-18: qty 2

## 2016-05-18 MED ORDER — GENTAMICIN SULFATE 40 MG/ML IJ SOLN
INTRAMUSCULAR | Status: AC
  Administered 2016-05-18: 340 mL via INTRAVENOUS
  Filled 2016-05-18: qty 8.5

## 2016-05-18 SURGICAL SUPPLY — 54 items
APL SKNCLS STERI-STRIP NONHPOA (GAUZE/BANDAGES/DRESSINGS) ×1
APPLIER CLIP 11 MED OPEN (CLIP)
APPLIER CLIP 13 LRG OPEN (CLIP)
APR CLP LRG 13 20 CLIP (CLIP)
APR CLP MED 11 20 MLT OPN (CLIP)
BAG HAMPER (MISCELLANEOUS) ×2 IMPLANT
BENZOIN TINCTURE PRP APPL 2/3 (GAUZE/BANDAGES/DRESSINGS) ×1 IMPLANT
CELLS DAT CNTRL 66122 CELL SVR (MISCELLANEOUS) ×1 IMPLANT
CLIP APPLIE 11 MED OPEN (CLIP) IMPLANT
CLIP APPLIE 13 LRG OPEN (CLIP) IMPLANT
CLOTH BEACON ORANGE TIMEOUT ST (SAFETY) ×2 IMPLANT
COVER LIGHT HANDLE STERIS (MISCELLANEOUS) ×4 IMPLANT
DRAPE WARM FLUID 44X44 (DRAPE) ×2 IMPLANT
DRSG OPSITE POSTOP 4X10 (GAUZE/BANDAGES/DRESSINGS) ×1 IMPLANT
DRSG OPSITE POSTOP 4X8 (GAUZE/BANDAGES/DRESSINGS) IMPLANT
DURAPREP 26ML APPLICATOR (WOUND CARE) ×2 IMPLANT
ELECT REM PT RETURN 9FT ADLT (ELECTROSURGICAL) ×2
ELECTRODE REM PT RTRN 9FT ADLT (ELECTROSURGICAL) ×1 IMPLANT
EVACUATOR DRAINAGE 10X20 100CC (DRAIN) IMPLANT
EVACUATOR SILICONE 100CC (DRAIN)
GLOVE BIOGEL PI IND STRL 7.0 (GLOVE) ×2 IMPLANT
GLOVE BIOGEL PI IND STRL 9 (GLOVE) ×1 IMPLANT
GLOVE BIOGEL PI INDICATOR 7.0 (GLOVE) ×2
GLOVE BIOGEL PI INDICATOR 9 (GLOVE) ×1
GLOVE ECLIPSE 9.0 STRL (GLOVE) ×2 IMPLANT
GOWN SPEC L3 XXLG W/TWL (GOWN DISPOSABLE) ×2 IMPLANT
GOWN STRL REUS W/TWL LRG LVL3 (GOWN DISPOSABLE) ×5 IMPLANT
INST SET MAJOR GENERAL (KITS) ×2 IMPLANT
KIT ROOM TURNOVER APOR (KITS) ×2 IMPLANT
MANIFOLD NEPTUNE II (INSTRUMENTS) ×2 IMPLANT
NS IRRIG 1000ML POUR BTL (IV SOLUTION) ×4 IMPLANT
PACK ABDOMINAL MAJOR (CUSTOM PROCEDURE TRAY) ×2 IMPLANT
PAD ARMBOARD 7.5X6 YLW CONV (MISCELLANEOUS) ×2 IMPLANT
RETRACTOR WND ALEXIS 18 MED (MISCELLANEOUS) IMPLANT
RETRACTOR WND ALEXIS 25 LRG (MISCELLANEOUS) IMPLANT
RTRCTR WOUND ALEXIS 18CM MED (MISCELLANEOUS) ×2
RTRCTR WOUND ALEXIS 25CM LRG (MISCELLANEOUS)
SET BASIN LINEN APH (SET/KITS/TRAYS/PACK) ×2 IMPLANT
SPONGE DRAIN TRACH 4X4 STRL 2S (GAUZE/BANDAGES/DRESSINGS) IMPLANT
SPONGE LAP 18X18 X RAY DECT (DISPOSABLE) ×1 IMPLANT
STRIP CLOSURE SKIN 1/2X4 (GAUZE/BANDAGES/DRESSINGS) ×3 IMPLANT
SUT CHROMIC 0 CT 1 (SUTURE) ×22 IMPLANT
SUT CHROMIC 2 0 CT 1 (SUTURE) ×3 IMPLANT
SUT ETHILON 3 0 FSL (SUTURE) IMPLANT
SUT PDS AB CT VIOLET #0 27IN (SUTURE) IMPLANT
SUT PLAIN CT 1/2CIR 2-0 27IN (SUTURE) ×2 IMPLANT
SUT PROLENE 0 CT 1 30 (SUTURE) ×1 IMPLANT
SUT VIC AB 0 CT1 27 (SUTURE)
SUT VIC AB 0 CT1 27XBRD ANTBC (SUTURE) IMPLANT
SUT VIC AB 2-0 CT1 27 (SUTURE)
SUT VIC AB 2-0 CT1 TAPERPNT 27 (SUTURE) IMPLANT
SUT VICRYL 4 0 KS 27 (SUTURE) ×2 IMPLANT
TOWEL BLUE STERILE X RAY DET (MISCELLANEOUS) ×2 IMPLANT
TRAY FOLEY W/METER SILVER 16FR (SET/KITS/TRAYS/PACK) ×2 IMPLANT

## 2016-05-18 NOTE — Progress Notes (Signed)
Notified MD, patient with nausea and very anxious. Family at bedside. Will follow through with new orders.

## 2016-05-18 NOTE — Anesthesia Postprocedure Evaluation (Signed)
Anesthesia Post Note  Patient: Laura [name]  Procedure(s) Performed: Procedure(s) (LRB): HYSTERECTOMY ABDOMINAL (N/A) BILATERAL SALPINGECTOMY (N/A)  Patient location during evaluation: PACU Anesthesia Type: General Level of consciousness: awake and alert and oriented Pain management: pain level controlled Vital Signs Assessment: post-procedure vital signs reviewed and stable Respiratory status: spontaneous breathing and respiratory function stable Cardiovascular status: stable Anesthetic complications: no     Last Vitals:  Vitals:   05/18/16 0725 05/18/16 0945  BP: (!) 93/58 (!) 108/53  Pulse:    Resp: (!) 25 17  Temp:  36.4 C    Last Pain:  Vitals:   05/18/16 1000  TempSrc:   PainSc: 8                  ADAMS, AMY A

## 2016-05-18 NOTE — Anesthesia Preprocedure Evaluation (Signed)
Anesthesia Evaluation  Patient identified by MRN, date of birth, ID band Patient awake    Reviewed: Allergy & Precautions, NPO status , Patient's Chart, lab work & pertinent test results  Airway Mallampati: II  TM Distance: >3 FB     Dental  (+) Teeth Intact   Pulmonary Current Smoker,    breath sounds clear to auscultation       Cardiovascular + dysrhythmias Supra Ventricular Tachycardia  Rhythm:Regular Rate:Normal     Neuro/Psych PSYCHIATRIC DISORDERS Anxiety    GI/Hepatic neg GERD  ,  Endo/Other    Renal/GU      Musculoskeletal   Abdominal   Peds  Hematology   Anesthesia Other Findings   Reproductive/Obstetrics                             Anesthesia Physical Anesthesia Plan  ASA: II  Anesthesia Plan: General   Post-op Pain Management:    Induction: Intravenous  Airway Management Planned: Oral ETT  Additional Equipment:   Intra-op Plan:   Post-operative Plan: Extubation in OR  Informed Consent: I have reviewed the patients History and Physical, chart, labs and discussed the procedure including the risks, benefits and alternatives for the proposed anesthesia with the patient or authorized representative who has indicated his/her understanding and acceptance.     Plan Discussed with:   Anesthesia Plan Comments:         Anesthesia Quick Evaluation

## 2016-05-18 NOTE — H&P (Signed)
Patient ID: Laura [name], female   DOB: 06/19/1965, 51 y.o.   MRN: [id number] Preoperative History and Physical  Laura [name] is a 51 y.o. [id number] here for surgical management of CIN-II involving endocervix and cervix, dysmenorrhea, and heavy periods. No significant preoperative concerns. Pt is having associated symptoms of lower back pain and pelvic pain x 3 days before and 5-6 days after menstrual cycle. Pt states that she has 7 days of menstrual bleeding. Pt hasn't tried any medications for the relief of her symptoms. Pt denies any other symptoms. Pt notes that she has had past C-sections.  The pateint has declined consideration of conization of cervix, an alternative tx, due to the bleeding period concernss. Proposed surgery: abdominal hysterectomy with bilateral salpingectomy and removal of cervix.       Past Medical History:  Diagnosis Date  . Benign breast cyst in female   . Bronchitis 06/2013  . Otitis media 06/2013  . Pneumonia         Past Surgical History:  Procedure Laterality Date  . BREAST SURGERY Right 2005   mass removed  . CESAREAN SECTION  [id number]  . TUBAL LIGATION                     OB History  Gravida Para Term Preterm AB Living  2 2 2     2   SAB TAB Ectopic Multiple Live Births          2    # Outcome Date GA Lbr Len/2nd Weight Sex Delivery Anes PTL Lv  2 Term 07/04/87 [id number]   F CS-LTranv   LIV  1 Term 11/17/83 4839w0d   M CS-LTranv   LIV    Patient denies any other pertinent gynecologic issues.   No current outpatient prescriptions on file prior to visit.   No current facility-administered medications on file prior to visit.         Allergies  Allergen Reactions  . Penicillins Shortness Of Breath    Has patient had a PCN reaction causing immediate rash, facial/tongue/throat swelling, SOB or lightheadedness with hypotension: Yes Has patient had a PCN reaction causing severe rash involving mucus membranes or skin necrosis:  No Has patient had a PCN reaction that required hospitalization No Has patient had a PCN reaction occurring within the last 10 years: No If all of the above answers are "NO", then may proceed with Cephalosporin use.   . Sulfa Antibiotics Other (See Comments)    blisters    Social History:   reports that she has been smoking Cigarettes.  She has a 15.00 pack-year smoking history. She has never used smokeless tobacco. She reports that she does not drink alcohol or use drugs.        Family History  Problem Relation Age of Onset  . CAD Father   . Lung cancer Paternal Grandfather     bone cancer  . Heart disease Paternal Grandmother   . Stomach cancer Maternal Grandmother   . Tremor Brother   . Parkinson's disease Brother   . Other Daughter     breast mass  . Asthma Son   . Other Son     Lyme's    Review of Systems: Noncontributory  PHYSICAL EXAM: Height 5' 8.9" (1.75 m), weight 152 lb 6.4 oz (69.1 kg), last menstrual period 04/27/2016. General appearance - alert, well appearing, and in no distress Chest - clear to auscultation, no wheezes, rales or rhonchi, symmetric air entry Heart -  normal rate and regular rhythm Abdomen - soft, nontender, nondistended, no masses or organomegaly. Well healed C-section surgical scar. Pelvic - normal external genitalia, vulva, vagina, cervix, uterus and adnexa,  VULVA: normal appearing vulva with no masses, tenderness or lesions,  VAGINA: normal appearing vagina with normal color and discharge, no lesions,  CERVIX: normal appearing cervix without discharge or lesions,  UTERUS: uterus is normal size, shape, consistency and nontender,  ADNEXA: normal adnexa in size, nontender and no masses,  Extremities - peripheral pulses normal, no pedal edema, no clubbing or cyanosis  Labs: No results found for this or any previous visit (from the past 336 hour(s)).  Imaging Studies: ImagingResults  No results found.     Assessment: CIN-II involving endocervix and cervix Dysmenorrhea Peri-menstrual pain     Patient Active Problem List   Diagnosis Date Noted  . PSVT (paroxysmal supraventricular tachycardia) (HCC) 12/04/2013  . Family history of ischemic heart disease 12/04/2013  . Tobacco use 12/04/2013    Plan: Patient will undergo surgical management with abdominal hysterectomy with bilateral salpingectomy and removal of cervix.    Tilda Burrow, MD 05/06/2016 9:34 AM   By signing my name below, I, Soijett Blue, attest that this documentation has been prepared under the direction and in the presence of Tilda Burrow, MD. Electronically Signed: Soijett Blue, ED Scribe. 05/06/16. 9:36 AM.  I personally performed the services described in this documentation, which was SCRIBED in my presence. The recorded information has been reviewed and considered accurate. It has been edited as necessary during review. Tilda Burrow, MD

## 2016-05-18 NOTE — Op Note (Signed)
Please see the brief operative note for surgical details 

## 2016-05-18 NOTE — Transfer of Care (Signed)
Immediate Anesthesia Transfer of Care Note  Patient: Laura [name]  Procedure(s) Performed: Procedure(s): HYSTERECTOMY ABDOMINAL (N/A) BILATERAL SALPINGECTOMY (N/A)  Patient Location: PACU  Anesthesia Type:General  Level of Consciousness: awake, oriented and patient cooperative  Airway & Oxygen Therapy: Patient Spontanous Breathing and Patient connected to face mask oxygen  Post-op Assessment: Report given to RN and Post -op Vital signs reviewed and stable  Post vital signs: Reviewed and stable  Last Vitals:  Vitals:   05/18/16 0720 05/18/16 0725  BP: 94/60 (!) 93/58  Pulse:    Resp: 20 (!) 25  Temp:      Last Pain:  Vitals:   05/18/16 0650  TempSrc: Oral      Patients Stated Pain Goal: 5 (05/18/16 0650)  Complications: No apparent anesthesia complications

## 2016-05-18 NOTE — Brief Op Note (Signed)
05/18/2016  9:58 AM  PATIENT:  Laura [name]  51 y.o. female  PRE-OPERATIVE DIAGNOSIS:  Cervical dysplasia with endocervix involvement; menorrhagia dysmenorrhea CIN II  POST-OPERATIVE DIAGNOSIS:  Cervical dysplasia with endocervix involvement; menorrhagia dysmenorrhea CIN II  PROCEDURE:  Procedure(s): HYSTERECTOMY ABDOMINAL (N/A) BILATERAL SALPINGECTOMY (N/A)  SURGEON:  Surgeon(s) and Role:    * Tilda BurrowJohn Mega Kinkade V, MD - Primary  PHYSICIAN ASSISTANT:   ASSISTANTS: [place] RNFA   ANESTHESIA:   local and general  EBL:  Total I/O In: 2700 [I.V.:2700] Out: 225 [Urine:100; Blood:125]  BLOOD ADMINISTERED:none  DRAINS: Urinary Catheter (Foley)   LOCAL MEDICATIONS USED:  MARCAINE    and Amount: 20 ml  SPECIMEN:  Source of Specimen:  Uterus fallopian tubes cervix  DISPOSITION OF SPECIMEN:  PATHOLOGY  COUNTS:  YES  TOURNIQUET:  * No tourniquets in log *  DICTATION: .Dragon Dictation  PLAN OF CARE: Admit to inpatient   PATIENT DISPOSITION:  PACU - hemodynamically stable.   Delay start of Pharmacological VTE agent (>24hrs) due to surgical blood loss or risk of bleeding: not applicable  Details of procedure: Patient was taken operating room prepped and draped for lower abdominal surgery along the old Pfannenstiel incision. Timeout conducted, and antibiotics administered gentamicin and Cleocin due to penicillin allergy. Transverse lower abdominal incision was repeated, with excision of the old cicatrix a 1 cm strip of adjacent skin epithelium. Dissection through the fascia was performed and into the peritoneal cavity done without difficulty. There were some thin filmy omental adhesions to the lower anterior peritoneum that were sharply dissected. Hemostasis was good. Bowel was packed away. A Lexus retractor positioned. Anteflexed and easily accessible. The left adnexa was adherent to the sidewall and required sharp dissection to free up the lid and the adnexal structures.  Normalization of anatomy was attempted. There were 2 Filshie clips on the left fallopian tube and these were completely covered over and reperitonealized and adherent to the anterior surface of the left ovary. Fimbriae were visible distally the dissection had a tendency to be somewhat vascular so it was decided not to try to remove the Filshie clips since they were so thoroughly covered over with peritoneum and removing them would result in the severely denuded ovary. Round ligament on the left was taken down, and then the left utero-ovarian ligament isolated clamped cut and suture ligated. Care was taken not to include the proximal tubal stump in the ligation. The uterine vessels were crossclamped with curved Heaney clamp Kelly clamp place for backbleeding and suture ligation performed. Taken down anteriorly. There were some somewhat dense adhesions on the left side. On the right side round ligament was taken down, the developed, the utero-ovarian ligament isolated clamped cut and suture ligated. On the right Side the fallopian tube could be easily isolated from the normal-appearing ovary which had a evidence of recent ovulation on its surface. Round ligaments were taken down on the right, the uterine vessels clamped cut and suture ligated and then the upper and lower cardinal ligament serially taken down with straight Heaney clamp knife dissection and 0 chromic suture ligature, and small bites we marched down on to the level of the insertion of the uterosacral ligaments posteriorly. At this time the anterior cervicovaginal fornix could be identified with a single stab incision anteriorly with Coker clamp placed on the anterior vaginal cuff. The cervix was amputated off of the cuff and Coker clamps placed that lateral vaginal angles daily to the lower cardinal ligaments. Vaginal angle was incorporated into  the lower cardinal ligament with Aldridge stitch on each side with 0 chromic was the suture of choice  throughout the case except as noted elsewhere. The cuff was then closed in a series of figure-of-eight sutures front to back and tissue approximation and hemostasis. Pelvis Was irrigated and inspected. Hemostasis was satisfactory. A single stitch was loosely applied to the posterior aspects of the cuff pulling the flap edge into loose approximation with the posterior cuff. The fallopian tube on the right was easily taken down with sharp dissection of all adhesions, Kelley clamp placed across the mesosalpinx the tube removed and then 2-0 chromic used to tie the pedicle. The ovary appeared good vascularity and was away from the vaginal cuff. On the left side the fimbria could be amputated off and removed the area of the fallopian tube where the Filshie clips were attached was covered over with peritoneum and it was felt in the extensive areas of needing on the side that the clips could be better left in situ without other adhesions to develop. A 1.5 cm left para ovarian cyst was removed Hemostasis was considered satisfactory, anterior peritoneum closed with 2-0 chromic after laparotomy equipment removed, and 0 Vicryl used to close the fascia 20 plain was used to reapproximate subcutaneous skin and then 4-0 Vicryl used in a Keith needle to do subcuticular skin closure sponge and needle counts were correct throughout patient to recovery room in good condition EBL 125 cc.

## 2016-05-18 NOTE — Anesthesia Procedure Notes (Signed)
Procedure Name: Intubation Date/Time: 05/18/2016 7:37 AM Performed by: Pernell DupreADAMS, Reginald Weida A Pre-anesthesia Checklist: Patient identified, Patient being monitored, Timeout performed, Emergency Drugs available and Suction available Patient Re-evaluated:Patient Re-evaluated prior to inductionOxygen Delivery Method: Circle System Utilized Preoxygenation: Pre-oxygenation with 100% oxygen Intubation Type: IV induction Ventilation: Mask ventilation without difficulty Laryngoscope Size: Miller and 3 Grade View: Grade I Tube type: Oral Tube size: 7.0 mm Number of attempts: 1 Airway Equipment and Method: Stylet Placement Confirmation: ETT inserted through vocal cords under direct vision,  positive ETCO2 and breath sounds checked- equal and bilateral Secured at: 21 cm Tube secured with: Tape Dental Injury: Teeth and Oropharynx as per pre-operative assessment

## 2016-05-19 ENCOUNTER — Encounter (HOSPITAL_COMMUNITY): Payer: Self-pay | Admitting: Obstetrics and Gynecology

## 2016-05-19 LAB — BASIC METABOLIC PANEL
ANION GAP: 4 — AB (ref 5–15)
BUN: 12 mg/dL (ref 6–20)
CALCIUM: 8.7 mg/dL — AB (ref 8.9–10.3)
CO2: 28 mmol/L (ref 22–32)
CREATININE: 0.71 mg/dL (ref 0.44–1.00)
Chloride: 105 mmol/L (ref 101–111)
Glucose, Bld: 106 mg/dL — ABNORMAL HIGH (ref 65–99)
Potassium: 4.2 mmol/L (ref 3.5–5.1)
SODIUM: 137 mmol/L (ref 135–145)

## 2016-05-19 LAB — CBC WITH DIFFERENTIAL/PLATELET
BASOS ABS: 0 10*3/uL (ref 0.0–0.1)
BASOS PCT: 0 %
EOS ABS: 0.1 10*3/uL (ref 0.0–0.7)
Eosinophils Relative: 1 %
HCT: 36.4 % (ref 36.0–46.0)
HEMOGLOBIN: 12 g/dL (ref 12.0–15.0)
Lymphocytes Relative: 28 %
Lymphs Abs: 3.2 10*3/uL (ref 0.7–4.0)
MCH: 30.5 pg (ref 26.0–34.0)
MCHC: 33 g/dL (ref 30.0–36.0)
MCV: 92.6 fL (ref 78.0–100.0)
MONOS PCT: 8 %
Monocytes Absolute: 0.9 10*3/uL (ref 0.1–1.0)
NEUTROS PCT: 63 %
Neutro Abs: 7.3 10*3/uL (ref 1.7–7.7)
Platelets: 200 10*3/uL (ref 150–400)
RBC: 3.93 MIL/uL (ref 3.87–5.11)
RDW: 14.1 % (ref 11.5–15.5)
WBC: 11.4 10*3/uL — AB (ref 4.0–10.5)

## 2016-05-19 MED ORDER — PROMETHAZINE HCL 12.5 MG PO TABS: 25.0000 mg | ORAL_TABLET | Freq: Four times a day (QID) | ORAL | Status: DC | PRN

## 2016-05-19 MED ORDER — LACTATED RINGERS IV SOLN
INTRAVENOUS | Status: DC
  Administered 2016-05-19: 04:00:00 via INTRAVENOUS

## 2016-05-19 MED ORDER — KETOROLAC TROMETHAMINE 10 MG PO TABS
10.0000 mg | ORAL_TABLET | Freq: Four times a day (QID) | ORAL | Status: DC
  Administered 2016-05-19 – 2016-05-20 (×5): 10 mg via ORAL
  Filled 2016-05-19 (×5): qty 1

## 2016-05-19 MED ORDER — OXYCODONE-ACETAMINOPHEN 5-325 MG PO TABS
1.0000 | ORAL_TABLET | Freq: Four times a day (QID) | ORAL | Status: DC | PRN
  Administered 2016-05-19 – 2016-05-20 (×4): 2 via ORAL
  Filled 2016-05-19 (×4): qty 2

## 2016-05-19 NOTE — Anesthesia Postprocedure Evaluation (Signed)
Anesthesia Post Note  Patient: Laura [name]  Procedure(s) Performed: Procedure(s) (LRB): HYSTERECTOMY ABDOMINAL (N/A) BILATERAL SALPINGECTOMY (N/A)  Patient location during evaluation: Nursing Unit Anesthesia Type: General Level of consciousness: awake and alert and oriented Pain management: pain level controlled Vital Signs Assessment: post-procedure vital signs reviewed and stable Respiratory status: spontaneous breathing Cardiovascular status: stable Anesthetic complications: no     Last Vitals:  Vitals:   05/18/16 2041 05/19/16 0508  BP: (!) 100/52 (!) 110/57  Pulse: 60 64  Resp: 16 16  Temp: 37 C 37.1 C    Last Pain:  Vitals:   05/19/16 0913  TempSrc:   PainSc: 6                  ADAMS, AMY A

## 2016-05-19 NOTE — Addendum Note (Signed)
Addendum  created 05/19/16 1031 by Earleen NewportAmy A Jailey Booton, CRNA   Sign clinical note

## 2016-05-19 NOTE — Progress Notes (Signed)
Patient loss of IV access. Dr. Emelda FearFerguson notified. New orders to discontinue IV fluids, IV Toradol, and Fentanyl. New orders Percocet 5/325mg  1-2 tablets every 6 hours as needed for pain, Phenergan 25mg  every 6 hours as needed for nausea and vomiting, and Toradol 10mg  every 6 hours.

## 2016-05-20 DIAGNOSIS — N871 Moderate cervical dysplasia: Secondary | ICD-10-CM | POA: Diagnosis present

## 2016-05-20 MED ORDER — DOCUSATE SODIUM 100 MG PO CAPS: 100.0000 mg | ORAL_CAPSULE | Freq: Two times a day (BID) | ORAL | 2 refills | Status: DC | PRN

## 2016-05-20 MED ORDER — OXYCODONE-ACETAMINOPHEN 5-325 MG PO TABS: 1.0000 | ORAL_TABLET | Freq: Four times a day (QID) | ORAL | 0 refills | Status: DC | PRN

## 2016-05-20 NOTE — Discharge Summary (Signed)
Physician Discharge Summary  Patient ID: Laura [name] MRN: [id number] DOB/AGE: 51/05/1965 50 y.o.  Admit date: 05/18/2016 Discharge date: 05/20/2016  Admission Diagnoses: CIN II of cervix, endocervical involvement                                         Dysmenorrhea,                                         Menorrhagia  Discharge Diagnoses: CIN II                                        Dysmenorrhea                                        Menorrhagia Active Problems:   Status post abdominal hysterectomy   Discharged Condition: good  Hospital Course: Laura [name] a 51 y.o.G2P2002 here for surgical management of CIN-II involving endocervix and cervix, dysmenorrhea, and heavy periods. No significant preoperative concerns. Pt is having associated symptoms of lower back pain and pelvic pain x 3 days before and 5-6 days after menstrual cycle. Pt states that she has 7 days of menstrual bleeding. Pt hasn't tried any medications for the relief of her symptoms. Pt denies any other symptoms. Pt notes that she has had past C-sections.  The pateint has declined consideration of conization of cervix, an alternative tx, due to the bleeding period concernss. She had abdominal hysterectomy and bilateral salpingectomy.  A small portion of the left tube was necessarily left in situ, as the area with Filshie clips was adherent to the left ovary. And was felt less likely to cause problems if not dissected out.  Consults: None  Significant Diagnostic Studies: labs: Pathology: CIN II with endocervix involvement. The margins are clear.    Treatments: surgery: abdominal hysterectomy bilateral salpingectomy  Discharge Exam: Blood pressure (!) 108/53, pulse 70, temperature 98 F (36.7 C), temperature source Oral, resp. rate 18, height 5' 8.5" (1.74 m), weight 152 lb (68.9 kg), last menstrual period 05/11/2016, SpO2 95 %. General appearance: alert, cooperative and no distress Head: Normocephalic, without  obvious abnormality, atraumatic Resp: clear to auscultation bilaterally GI: soft, non-tender; bowel sounds normal; no masses,  no organomegaly Extremities: extremities normal, atraumatic, no cyanosis or edema and Homans sign is negative, no sign of DVT  Disposition: 01-Home or Self Care  Discharge Instructions    Call MD for:  persistant nausea and vomiting    Complete by:  As directed    Call MD for:  severe uncontrolled pain    Complete by:  As directed    Call MD for:  temperature >100.4    Complete by:  As directed    Diet - low sodium heart healthy    Complete by:  As directed    Increase activity slowly    Complete by:  As directed      Allergies as of 05/20/2016      Reactions   Penicillins Shortness Of Breath   Has patient had a PCN reaction causing immediate rash, facial/tongue/throat swelling, SOB or lightheadedness with  hypotension: Yes Has patient had a PCN reaction causing severe rash involving mucus membranes or skin necrosis: No Has patient had a PCN reaction that required hospitalization No Has patient had a PCN reaction occurring within the last 10 years: No If all of the above answers are "NO", then may proceed with Cephalosporin use.   Sulfa Antibiotics Other (See Comments)   blisters      Medication List    TAKE these medications   docusate sodium 100 MG capsule Commonly known as:  COLACE Take 1 capsule (100 mg total) by mouth 2 (two) times daily as needed.   naproxen sodium 220 MG tablet Commonly known as:  ANAPROX Take 440 mg by mouth 3 (three) times daily as needed (pain).   oxyCODONE-acetaminophen 5-325 MG tablet Commonly known as:  PERCOCET/ROXICET Take 1 tablet by mouth every 6 (six) hours as needed for moderate pain or severe pain.      Follow-up Information    Tilda Burrow, MD Follow up in 2 week(s).   Specialties:  Obstetrics and Gynecology, Radiology Contact information: [address] [place] [address] [phone]            Signed: Tilda Burrow 05/20/2016, 8:17 AM

## 2016-05-20 NOTE — Discharge Instructions (Signed)
Abdominal Hysterectomy, Care After °These instructions give you information on caring for yourself after your procedure. Your doctor may also give you more specific instructions. Call your doctor if you have any problems or questions after your procedure. °Follow these instructions at home: °It takes 4-6 weeks to recover from this surgery. Follow all of your doctor's instructions. °· Only take medicines as told by your doctor. °· Change your bandage as told by your doctor. °· Return to your doctor to have your stitches taken out. °· Take showers for 2-3 weeks. Ask your doctor when it is okay to shower. °· Do not douche, use tampons, or have sex (intercourse) for at least 6 weeks or as told. °· Follow your doctor's advice about exercise, lifting objects, driving, and general activities. °· Get plenty of rest and sleep. °· Do not lift anything heavier than a gallon of milk (about 10 pounds [4.5 kilograms]) for the first month after surgery. °· Get back to your normal diet as told by your doctor. °· Do not drink alcohol until your doctor says it is okay. °· Take a medicine to help you poop (laxative) as told by your doctor. °· Eating foods high in fiber may help you poop. Eat a lot of raw fruits and vegetables, whole grains, and beans. °· Drink enough fluids to keep your pee (urine) clear or pale yellow. °· Have someone help you at home for 1-2 weeks after your surgery. °· Keep follow-up doctor visits as told. °Contact a doctor if: °· You have chills or fever. °· You have puffiness, redness, or pain in area of the cut (incision). °· You have yellowish-white fluid (pus) coming from the cut. °· You have a bad smell coming from the cut or bandage. °· Your cut pulls apart. °· You feel dizzy or light-headed. °· You have pain or bleeding when you pee. °· You keep having watery poop (diarrhea). °· You keep feeling sick to your stomach (nauseous) or keep throwing up (vomiting). °· You have fluid (discharge) coming from your  vagina. °· You have a rash. °· You have a reaction to your medicine. °· You need stronger pain medicine. °Get help right away if: °· You have a fever and your symptoms suddenly get worse. °· You have bad belly (abdominal) pain. °· You have chest pain. °· You are short of breath. °· You pass out (faint). °· You have pain, puffiness, or redness of your leg. °· You bleed a lot from your vagina and notice clumps of tissue (clots). °This information is not intended to replace advice given to you by your health care provider. Make sure you discuss any questions you have with your health care provider. °Document Released: 01/20/2008 Document Revised: 09/18/2015 Document Reviewed: 02/02/2013 °Elsevier Interactive Patient Education © 2017 [employer] ° °

## 2016-05-20 NOTE — Progress Notes (Signed)
Patient discharged home.  Reviewed DC instructions and medications, follow up in place.  Educated on s/s of infection and when to call MD.  Laura GammonVerbalizes understanding.  No questions at this time.  Instructed to call staff when husband here to pick up, and will be assisted off unit.  Patient in NAD at this time.

## 2016-05-20 NOTE — Progress Notes (Signed)
2 Days Post-Op Procedure(s) (LRB): HYSTERECTOMY ABDOMINAL (N/A) BILATERAL SALPINGECTOMY (N/A)  Subjective: Patient reports incisional pain, tolerating PO and no problems voiding.    Objective: I have reviewed patient's vital signs, intake and output, labs, microbiology and pathology.  General: alert, cooperative and no distress Resp: clear to auscultation bilaterally GI: soft, non-tender; bowel sounds normal; no masses,  no organomegaly Extremities: extremities normal, atraumatic, no cyanosis or edema CBC Latest Ref Rng & Units 05/19/2016 05/14/2016 12/05/2013  WBC 4.0 - 10.5 K/uL 11.4(H) 9.1 9.1  Hemoglobin 12.0 - 15.0 g/dL 04.512.0 40.914.2 81.113.2  Hematocrit 36.0 - 46.0 % 36.4 43.1 40.5  Platelets 150 - 400 K/uL 200 239 223  Pathology: CIN II with endocervical involvement Clear margins.  Assessment: s/p Procedure(s): HYSTERECTOMY ABDOMINAL (N/A) BILATERAL SALPINGECTOMY (N/A): stable and tolerating diet  Plan: Advance diet Discharge home  LOS: 2 days    Laura [name] V 05/20/2016, 7:30 AM

## 2016-05-25 ENCOUNTER — Telehealth: Payer: Self-pay | Admitting: *Deleted

## 2016-05-25 NOTE — Telephone Encounter (Signed)
Patient called with complaints of neck and shoulder stiffness. States she is not taking pain medication anymore. She has been out of bed moving frequently using a heating pad often and is passing gas. Pt wants to know if she could get a muscle relaxer. I informed patient that since she has been in the bed recently, the stiffness could be from that. Please advise.

## 2016-05-26 MED ORDER — METHOCARBAMOL 500 MG PO TABS: 500.0000 mg | ORAL_TABLET | Freq: Four times a day (QID) | ORAL | 0 refills | Status: DC

## 2016-05-26 NOTE — Telephone Encounter (Signed)
rx robaxin 500 mg x 20 tabs.

## 2016-05-27 ENCOUNTER — Telehealth: Payer: Self-pay | Admitting: Obstetrics and Gynecology

## 2016-05-27 NOTE — Telephone Encounter (Signed)
Had hysterectomy 1/23 and dressing still on, it is OK to remove.

## 2016-05-27 NOTE — Telephone Encounter (Signed)
Spoke with pt. Pt had surgery 05/18/16, abd hyst. Pt still has dressing on. She has been getting in shower. Pt wonders if she should take dressing off. Pt states Dr. Emelda FearFerguson said dressing would fall off, but it hasn't yet. Pt concerned about it not getting air. She is scheduled to see Dr. Emelda FearFerguson 2/9. Please advise. Thanks!! JSY

## 2016-06-02 ENCOUNTER — Encounter: Payer: Medicaid Other | Admitting: Obstetrics and Gynecology

## 2016-06-04 ENCOUNTER — Encounter: Payer: Self-pay | Admitting: Obstetrics and Gynecology

## 2016-06-04 ENCOUNTER — Ambulatory Visit (INDEPENDENT_AMBULATORY_CARE_PROVIDER_SITE_OTHER): Payer: Medicaid Other | Admitting: Obstetrics and Gynecology

## 2016-06-04 VITALS — BP 80/58 | HR 80 | Wt 146.8 lb

## 2016-06-04 DIAGNOSIS — N871 Moderate cervical dysplasia: Secondary | ICD-10-CM

## 2016-06-04 DIAGNOSIS — Z9889 Other specified postprocedural states: Secondary | ICD-10-CM

## 2016-06-04 DIAGNOSIS — Z09 Encounter for follow-up examination after completed treatment for conditions other than malignant neoplasm: Secondary | ICD-10-CM | POA: Insufficient documentation

## 2016-06-04 NOTE — Progress Notes (Signed)
   Subjective:  Laura [name] is a 51 y.o. female now 2.5 weeks status post abdominal hysterectomy with bilateral salpingectomy   She does not have any complaints at this time Review of Systems Negative    Diet:   negative   Bowel movements : normal.  Pain is controlled with Aleve  Objective:  BP (!) 80/58 (BP Location: Right Arm, Patient Position: Sitting, Cuff Size: Normal)   [name] 80   Wt 146 lb 12.8 oz (66.6 kg)   LMP 05/11/2016   BMI 22.00 kg/m  General:Well developed, well nourished.  No acute distress. Abdomen: Bowel sounds normal, soft, non-tender. Pelvic Exam:    External Genitalia:  Normal.    Vagina: Normal, cuff is smooth and well approximated but thickened    Cervix: surgically absent    Uterus: surgically absent     Adnexa/Bimanual: Normal  Incision(s):   Healing well, no drainage, no erythema, no hernia, no swelling, no dehiscence,     Assessment:  Post-Op 2.5 weeks s/p  abdominal hysterectomy with bilateral salpingectomy   Doing well postoperatively.   Plan:  1.Wound care discussed   2. . current medications. 3. Activity restrictions: no sexual activity yet TIL 6 WK postop 4. return to work: not applicable. 5. Follow up in 4 weeks  By signing my name below, I, Sonum Patel, attest that this documentation has been prepared under the direction and in the presence of Tilda BurrowJohn Djimon Lundstrom V, MD. Electronically Signed: Sonum Patel, Neurosurgeoncribe. 06/04/16. 1:22 PM.  I personally performed the services described in this documentation, which was SCRIBED in my presence. The recorded information has been reviewed and considered accurate. It has been edited as necessary during review. Tilda BurrowFERGUSON,Oliviya Gilkison V, MD

## 2016-07-02 ENCOUNTER — Encounter: Payer: Medicaid Other | Admitting: Obstetrics and Gynecology

## 2016-07-07 ENCOUNTER — Encounter: Payer: Medicaid Other | Admitting: Obstetrics and Gynecology

## 2018-02-14 ENCOUNTER — Telehealth: Payer: Self-pay | Admitting: Obstetrics and Gynecology

## 2018-02-14 ENCOUNTER — Other Ambulatory Visit: Payer: Self-pay | Admitting: Obstetrics and Gynecology

## 2018-02-14 NOTE — Telephone Encounter (Signed)
Left message asking pt to call office to see if she plans to keep appt.

## 2018-05-12 ENCOUNTER — Encounter: Payer: Self-pay | Admitting: Emergency Medicine

## 2018-05-12 ENCOUNTER — Other Ambulatory Visit: Payer: Self-pay

## 2018-05-12 ENCOUNTER — Emergency Department (HOSPITAL_COMMUNITY)
Admission: EM | Admit: 2018-05-12 | Discharge: 2018-05-12 | Disposition: A | Discharge status: Home / Self Care | Payer: Self-pay | Attending: Emergency Medicine | Admitting: Emergency Medicine

## 2018-05-12 DIAGNOSIS — R350 Frequency of micturition: Secondary | ICD-10-CM | POA: Insufficient documentation

## 2018-05-12 DIAGNOSIS — Z79899 Other long term (current) drug therapy: Secondary | ICD-10-CM | POA: Insufficient documentation

## 2018-05-12 DIAGNOSIS — M545 Low back pain, unspecified: Secondary | ICD-10-CM

## 2018-05-12 DIAGNOSIS — F17210 Nicotine dependence, cigarettes, uncomplicated: Secondary | ICD-10-CM | POA: Insufficient documentation

## 2018-05-12 LAB — URINALYSIS, ROUTINE W REFLEX MICROSCOPIC
Bilirubin Urine: NEGATIVE
GLUCOSE, UA: NEGATIVE mg/dL
Ketones, ur: NEGATIVE mg/dL
Leukocytes, UA: NEGATIVE
NITRITE: NEGATIVE
PH: 6 (ref 5.0–8.0)
Protein, ur: NEGATIVE mg/dL
Specific Gravity, Urine: 1.008 (ref 1.005–1.030)

## 2018-05-12 MED ORDER — CYCLOBENZAPRINE HCL 5 MG PO TABS: 5.0000 mg | ORAL_TABLET | Freq: Three times a day (TID) | ORAL | 0 refills | Status: DC | PRN

## 2018-05-12 MED ORDER — CIPROFLOXACIN HCL 500 MG PO TABS: 500.0000 mg | ORAL_TABLET | Freq: Two times a day (BID) | ORAL | 0 refills | Status: DC

## 2018-05-12 MED ORDER — METHOCARBAMOL 500 MG PO TABS
1000.0000 mg | ORAL_TABLET | Freq: Once | ORAL | Status: AC
  Administered 2018-05-12: 1000 mg via ORAL
  Filled 2018-05-12: qty 2

## 2018-05-12 MED ORDER — NAPROXEN 500 MG PO TABS: ORAL_TABLET | ORAL | 0 refills | Status: DC

## 2018-05-12 MED ORDER — DEXAMETHASONE SODIUM PHOSPHATE 10 MG/ML IJ SOLN
10.0000 mg | Freq: Once | INTRAMUSCULAR | Status: AC
  Administered 2018-05-12: 10 mg via INTRAMUSCULAR
  Filled 2018-05-12: qty 1

## 2018-05-12 MED ORDER — FAMOTIDINE 20 MG PO TABS: 20.0000 mg | ORAL_TABLET | Freq: Two times a day (BID) | ORAL | 0 refills | Status: DC

## 2018-05-12 MED ORDER — PREDNISONE 20 MG PO TABS: ORAL_TABLET | ORAL | 0 refills | Status: DC

## 2018-05-12 MED ORDER — KETOROLAC TROMETHAMINE 60 MG/2ML IM SOLN
60.0000 mg | Freq: Once | INTRAMUSCULAR | Status: AC
  Administered 2018-05-12: 60 mg via INTRAMUSCULAR
  Filled 2018-05-12: qty 2

## 2018-05-12 MED ORDER — CIPROFLOXACIN HCL 250 MG PO TABS
500.0000 mg | ORAL_TABLET | Freq: Once | ORAL | Status: AC
  Administered 2018-05-12: 500 mg via ORAL
  Filled 2018-05-12: qty 2

## 2018-05-12 NOTE — ED Notes (Signed)
Pt sleeping at this time.

## 2018-05-12 NOTE — ED Triage Notes (Signed)
Pt reports lower back pain x 2 days, also reports urinary frequency and foul smell to urine.  Pt denies back strain or injury

## 2018-05-12 NOTE — ED Provider Notes (Signed)
[facility] EMERGENCY DEPARTMENT Provider Note   CSN: [id number] Arrival date & time: 05/12/18  0004  Time seen 1:40 AM   History   Chief Complaint Chief Complaint  Patient presents with  . Back Pain    HPI Laura [name] is a 53 y.o. female.  HPI patient states "my back is killing me".  Patient states her back pain started 2 days ago.  She denies any injury and denies any prior problems of her back.  She states movement makes it feel worse, laying still makes it feel better.  She denies any urinary or rectal incontinence.  She does describe frequency and states urinating makes her back hurt more.  She denies hematuria or abdominal pain.  She denies nausea, vomiting, or fever.  [facility]   Past Medical History:  Diagnosis Date  . Anxiety   . Benign breast cyst in female   . Bronchitis 06/2013  . Otitis media 06/2013  . Pneumonia     Patient Active Problem List   Diagnosis Date Noted  . Postop check 06/04/2016  . Status post abdominal hysterectomy 05/18/2016  . PSVT (paroxysmal supraventricular tachycardia) (HCC) 12/04/2013  . Family history of ischemic heart disease 12/04/2013  . Tobacco use 12/04/2013    Past Surgical History:  Procedure Laterality Date  . ABDOMINAL HYSTERECTOMY N/A 05/18/2016   Procedure: HYSTERECTOMY ABDOMINAL;  Surgeon: Tilda Burrow, MD;  Location: [facility];  Service: Gynecology;  Laterality: N/A;  . BILATERAL SALPINGECTOMY N/A 05/18/2016   Procedure: BILATERAL SALPINGECTOMY;  Surgeon: Tilda Burrow, MD;  Location: [facility];  Service: Gynecology;  Laterality: N/A;  . BREAST SURGERY Right 2005   mass removed  . CESAREAN SECTION  4098,1191  . TUBAL LIGATION       OB History    Gravida  2   Para  2   Term  2   Preterm      AB      Living  2     SAB      TAB      Ectopic      Multiple      Live Births  2            Home Medications    Prior to Admission medications   Medication Sig Start  Date End Date Taking? Authorizing Provider  ciprofloxacin (CIPRO) 500 MG tablet Take 1 tablet (500 mg total) by mouth 2 (two) times daily. 05/12/18   Devoria Albe, MD  cyclobenzaprine (FLEXERIL) 5 MG tablet Take 1 tablet (5 mg total) by mouth 3 (three) times daily as needed. 05/12/18   Devoria Albe, MD  docusate sodium (COLACE) 100 MG capsule Take 1 capsule (100 mg total) by mouth 2 (two) times daily as needed. Patient not taking: Reported on 06/04/2016 05/20/16   Tilda Burrow, MD  famotidine (PEPCID) 20 MG tablet Take 1 tablet (20 mg total) by mouth 2 (two) times daily. 05/12/18   Devoria Albe, MD  methocarbamol (ROBAXIN) 500 MG tablet Take 1 tablet (500 mg total) by mouth 4 (four) times daily. Patient not taking: Reported on 06/04/2016 05/26/16   Tilda Burrow, MD  naproxen (NAPROSYN) 500 MG tablet Take 1 po BID with food prn pain 05/17/18   Devoria Albe, MD  naproxen sodium (ANAPROX) 220 MG tablet Take 440 mg by mouth 3 (three) times daily as needed (pain).    [provider]  oxyCODONE-acetaminophen (PERCOCET/ROXICET) 5-325 MG tablet Take 1 tablet by  mouth every 6 (six) hours as needed for moderate pain or severe pain. 05/20/16   Tilda BurrowFerguson, John V, MD  predniSONE (DELTASONE) 20 MG tablet Take 3 po QD x 3d , then 2 po QD x 3d then 1 po QD x 3d 05/12/18   Devoria AlbeKnapp, Isador Castille, MD    Family History Family History  Problem Relation Age of Onset  . CAD Father   . Lung cancer Paternal Grandfather        bone cancer  . Heart disease Paternal Grandmother   . Stomach cancer Maternal Grandmother   . Tremor Brother   . Parkinson's disease Brother   . Other Daughter        breast mass  . Asthma Son   . Other Son        Lyme's    Social History Social History   Tobacco Use  . Smoking status: Current Every Day Smoker    Packs/day: 0.25    Years: 30.00    Pack years: 7.50    Types: Cigarettes  . Smokeless tobacco: Never Used  Substance Use Topics  . Alcohol use: No  . Drug use: No  Employed, she  does not do any heavy lifting   Allergies   Penicillins and Sulfa antibiotics   Review of Systems Review of Systems  All other systems reviewed and are negative.    Physical Exam Updated Vital Signs BP 115/62 (BP Location: Left Arm)   Pulse 76   Temp 97.7 F (36.5 C) (Oral)   Resp 16   Ht 5\' 9"  (1.753 m)   Wt 64 kg   LMP 05/11/2016   SpO2 98%   BMI 20.82 kg/m   Vital signs normal    Physical Exam Vitals signs and nursing note reviewed.  Constitutional:      General: She is not in acute distress.    Appearance: Normal appearance. She is well-developed. She is not ill-appearing or toxic-appearing.     Comments: Laying on the stretcher on her left side, seems painful when she changes positions.  HENT:     Head: Normocephalic and atraumatic.     Right Ear: External ear normal.     Left Ear: External ear normal.     Nose: Nose normal. No mucosal edema.     Mouth/Throat:     Dentition: No dental abscesses.     Pharynx: No uvula swelling.  Eyes:     Conjunctiva/sclera: Conjunctivae normal.     Pupils: Pupils are equal, round, and reactive to light.  Neck:     Musculoskeletal: Full passive range of motion without pain and normal range of motion.  Cardiovascular:     Rate and Rhythm: Normal rate.     Heart sounds: No friction rub.  Pulmonary:     Effort: Pulmonary effort is normal. No respiratory distress.  Chest:     Chest wall: No crepitus.  Musculoskeletal: Normal range of motion.        General: Tenderness present.       Back:     Comments: Patient is tender diffusely on her whole lumbar spine and states there is no localized area that is worse than the rest.  She is less tender on the paraspinous muscles of the lumbar spine.  She has positive straight leg raising bilaterally.  Her patellar reflexes are 3+ and equal bilaterally.  She has minimal discomfort with left or right flexion of the lumbar spine but states it hurts more to flex forward.  Skin:     General: Skin is warm and dry.     Coloration: Skin is not pale.     Findings: No erythema or rash.  Neurological:     Mental Status: She is alert and oriented to person, place, and time.     Cranial Nerves: No cranial nerve deficit.  Psychiatric:        Mood and Affect: Mood normal. Mood is not anxious.        Speech: Speech normal.        Behavior: Behavior normal.        Thought Content: Thought content normal.      ED Treatments / Results  Labs (all labs ordered are listed, but only abnormal results are displayed) Results for orders placed or performed during the hospital encounter of 05/12/18  Urinalysis, Routine w reflex microscopic  Result Value Ref Range   Color, Urine YELLOW YELLOW   APPearance CLEAR CLEAR   Specific Gravity, Urine 1.008 1.005 - 1.030   pH 6.0 5.0 - 8.0   Glucose, UA NEGATIVE NEGATIVE mg/dL   Hgb urine dipstick SMALL (A) NEGATIVE   Bilirubin Urine NEGATIVE NEGATIVE   Ketones, ur NEGATIVE NEGATIVE mg/dL   Protein, ur NEGATIVE NEGATIVE mg/dL   Nitrite NEGATIVE NEGATIVE   Leukocytes, UA NEGATIVE NEGATIVE   RBC / HPF 0-5 0 - 5 RBC/hpf   WBC, UA 0-5 0 - 5 WBC/hpf   Bacteria, UA MANY (A) NONE SEEN   Squamous Epithelial / LPF 0-5 0 - 5   Mucus PRESENT    Laboratory interpretation all normal except bacteriuria but negative nitrites and leukocytes    EKG None  Radiology No results found.  Procedures Procedures (including critical care time)  Medications Ordered in ED Medications  ketorolac (TORADOL) injection 60 mg (60 mg Intramuscular Given 05/12/18 0201)  dexamethasone (DECADRON) injection 10 mg (10 mg Intramuscular Given 05/12/18 0201)  methocarbamol (ROBAXIN) tablet 1,000 mg (1,000 mg Oral Given 05/12/18 0159)  ciprofloxacin (CIPRO) tablet 500 mg (500 mg Oral Given 05/12/18 0200)     Initial Impression / Assessment and Plan / ED Course  I have reviewed the triage vital signs and the nursing notes.  Pertinent labs & imaging results that  were available during my care of the patient were reviewed by me and considered in my medical decision making (see chart for details).     Patient drove herself to the ED.  She was given Toradol IM, Decadron IM, and given oral Robaxin.  She had no red flags of her back pain, her pain is very generalized without localization and she has no incontinence.  We discussed that her urine was equivocal for UTI, and had a lot of bacteria but no leukocytosis and no nitrate positive.  She was given Cipro for possible UTI.  Urine culture was sent.  Final Clinical Impressions(s) / ED Diagnoses   Final diagnoses:  Acute midline low back pain without sciatica    ED Discharge Orders         Ordered    naproxen (NAPROSYN) 500 MG tablet     05/12/18 0223    ciprofloxacin (CIPRO) 500 MG tablet  2 times daily     05/12/18 0223    cyclobenzaprine (FLEXERIL) 5 MG tablet  3 times daily PRN     05/12/18 0223    predniSONE (DELTASONE) 20 MG tablet     05/12/18 0223    famotidine (PEPCID) 20 MG tablet  2 times daily  05/12/18 0223          Plan discharge  Devoria AlbeIva Keiron Iodice, MD, Concha PyoFACEP    Deylan Canterbury, MD 05/12/18 Emeline [id number]

## 2018-05-12 NOTE — Discharge Instructions (Addendum)
Use ice and heat for comfort.  Take the medication as prescribed, do not start the naproxen until January 22.  Take the antibiotic until gone.  You will be called if your urine culture shows you did have an infection.  Recheck if you get a fever, vomiting, or your back pain becomes localized to a small area.

## 2018-05-13 LAB — URINE CULTURE
Culture: NO GROWTH
Special Requests: NORMAL

## 2018-11-08 ENCOUNTER — Ambulatory Visit (INDEPENDENT_AMBULATORY_CARE_PROVIDER_SITE_OTHER): Payer: BC Managed Care – PPO | Admitting: Obstetrics and Gynecology

## 2018-11-08 ENCOUNTER — Encounter: Payer: Self-pay | Admitting: Obstetrics and Gynecology

## 2018-11-08 ENCOUNTER — Other Ambulatory Visit: Payer: Self-pay

## 2018-11-08 VITALS — BP 95/63 | HR 79 | Ht 68.0 in | Wt 158.6 lb

## 2018-11-08 DIAGNOSIS — Z01419 Encounter for gynecological examination (general) (routine) without abnormal findings: Secondary | ICD-10-CM | POA: Diagnosis not present

## 2018-11-08 NOTE — Progress Notes (Signed)
Patient ID: Laura [name], female   DOB: 06/27/1965, 53 y.o.   MRN: [id number]  Assessment:  Annual Gyn Exam   Plan:  1. pap smear NOT done 2. return q 3+ yrs. 3    Annual mammogram advised after age 53 Subjective:  Laura [name] is a 53 y.o. female G2P2002 who presents for annual exam. Patient's last menstrual period was 05/11/2016. The patient has complaints today of s/p hysterectomy no cervix. Has odor when peeing but no other issues. Her and husband sleep in separate beds and has no desire for sex.  The following portions of the patient's history were reviewed and updated as appropriate: allergies, current medications, past family history, past medical history, past social history, past surgical history and problem list. Past Medical History:  Diagnosis Date  . Anxiety   . Benign breast cyst in female   . Bronchitis 06/2013  . Otitis media 06/2013  . Pneumonia     Past Surgical History:  Procedure Laterality Date  . ABDOMINAL HYSTERECTOMY N/A 05/18/2016   Procedure: HYSTERECTOMY ABDOMINAL;  Surgeon: Tilda BurrowJohn Brexlee Heberlein V, MD;  Location: [facility];  Service: Gynecology;  Laterality: N/A;  . BILATERAL SALPINGECTOMY N/A 05/18/2016   Procedure: BILATERAL SALPINGECTOMY;  Surgeon: Tilda BurrowJohn Asier Desroches V, MD;  Location: [facility];  Service: Gynecology;  Laterality: N/A;  . BREAST SURGERY Right 2005   mass removed  . CESAREAN SECTION  [id number]  . TUBAL LIGATION       Current Outpatient Medications:  .  ciprofloxacin (CIPRO) 500 MG tablet, Take 1 tablet (500 mg total) by mouth 2 (two) times daily., Disp: 14 tablet, Rfl: 0 .  cyclobenzaprine (FLEXERIL) 5 MG tablet, Take 1 tablet (5 mg total) by mouth 3 (three) times daily as needed., Disp: 30 tablet, Rfl: 0 .  docusate sodium (COLACE) 100 MG capsule, Take 1 capsule (100 mg total) by mouth 2 (two) times daily as needed. (Patient not taking: Reported on 06/04/2016), Disp: 30 capsule, Rfl: 2 .  famotidine (PEPCID) 20 MG tablet, Take 1 tablet (20 mg total) by  mouth 2 (two) times daily., Disp: 18 tablet, Rfl: 0 .  methocarbamol (ROBAXIN) 500 MG tablet, Take 1 tablet (500 mg total) by mouth 4 (four) times daily. (Patient not taking: Reported on 06/04/2016), Disp: 20 tablet, Rfl: 0 .  naproxen (NAPROSYN) 500 MG tablet, Take 1 po BID with food prn pain, Disp: 30 tablet, Rfl: 0 .  naproxen sodium (ANAPROX) 220 MG tablet, Take 440 mg by mouth 3 (three) times daily as needed (pain)., Disp: , Rfl:  .  oxyCODONE-acetaminophen (PERCOCET/ROXICET) 5-325 MG tablet, Take 1 tablet by mouth every 6 (six) hours as needed for moderate pain or severe pain., Disp: 30 tablet, Rfl: 0 .  predniSONE (DELTASONE) 20 MG tablet, Take 3 po QD x 3d , then 2 po QD x 3d then 1 po QD x 3d, Disp: 18 tablet, Rfl: 0  Review of Systems Constitutional: negative Gastrointestinal: negative Genitourinary: normal  Objective:  LMP 05/11/2016    BMI: There is no height or weight on file to calculate BMI.  General Appearance: Alert, appropriate appearance for age. No acute distress HEENT: Grossly normal Neck / Thyroid:  Cardiovascular: RRR; normal S1, S2, no murmur Lungs: CTA bilaterally Back: No CVAT Breast Exam: No masses or nodes.No dimpling, nipple retraction or discharge.  Gastrointestinal: Soft, non-tender, no masses or organomegaly Pelvic Exam:  VAGINA:good vaginal secretions and length CERVIX: surgically absent,  UTERUS: surgically absent, vaginal cuff well healed. Rectal exam: negative  without mass, lesions or tenderness, stool guaiac negative. Small rectocele. Lymphatic Exam: Non-palpable nodes in neck, clavicular, axillary, or inguinal regions  Skin: no rash or abnormalities Neurologic: Normal gait and speech, no tremor  Psychiatric: Alert and oriented, appropriate affect.  Urinalysis:Not done  By signing my name below, I, Samul Dada, attest that this documentation has been prepared under the direction and in the presence of Jonnie Kind, MD. Electronically Signed:  [place]. 11/08/18. 10:13 AM.  I personally performed the services described in this documentation, which was SCRIBED in my presence. The recorded information has been reviewed and considered accurate. It has been edited as necessary during review. Jonnie Kind, MD

## 2019-03-30 ENCOUNTER — Other Ambulatory Visit: Payer: Self-pay

## 2019-03-30 DIAGNOSIS — Z20822 Contact with and (suspected) exposure to covid-19: Secondary | ICD-10-CM

## 2019-03-31 LAB — NOVEL CORONAVIRUS, NAA: SARS-CoV-2, NAA: DETECTED — AB

## 2019-04-04 ENCOUNTER — Emergency Department (HOSPITAL_COMMUNITY)
Admission: EM | Admit: 2019-04-04 | Discharge: 2019-04-04 | Disposition: A | Discharge status: Home / Self Care | Payer: BC Managed Care – PPO | Attending: Emergency Medicine | Admitting: Emergency Medicine

## 2019-04-04 ENCOUNTER — Other Ambulatory Visit: Payer: Self-pay

## 2019-04-04 ENCOUNTER — Encounter (HOSPITAL_COMMUNITY): Payer: Self-pay | Admitting: *Deleted

## 2019-04-04 ENCOUNTER — Emergency Department (HOSPITAL_COMMUNITY): Payer: BC Managed Care – PPO

## 2019-04-04 DIAGNOSIS — R0602 Shortness of breath: Secondary | ICD-10-CM | POA: Diagnosis present

## 2019-04-04 DIAGNOSIS — U071 COVID-19: Secondary | ICD-10-CM | POA: Diagnosis not present

## 2019-04-04 DIAGNOSIS — Z7982 Long term (current) use of aspirin: Secondary | ICD-10-CM | POA: Insufficient documentation

## 2019-04-04 DIAGNOSIS — J441 Chronic obstructive pulmonary disease with (acute) exacerbation: Secondary | ICD-10-CM | POA: Diagnosis not present

## 2019-04-04 DIAGNOSIS — F17210 Nicotine dependence, cigarettes, uncomplicated: Secondary | ICD-10-CM | POA: Insufficient documentation

## 2019-04-04 LAB — CBC WITH DIFFERENTIAL/PLATELET
Abs Immature Granulocytes: 0.03 10*3/uL (ref 0.00–0.07)
Basophils Absolute: 0 10*3/uL (ref 0.0–0.1)
Basophils Relative: 0 %
Eosinophils Absolute: 0.1 10*3/uL (ref 0.0–0.5)
Eosinophils Relative: 2 %
HCT: 45.7 % (ref 36.0–46.0)
Hemoglobin: 14.5 g/dL (ref 12.0–15.0)
Immature Granulocytes: 1 %
Lymphocytes Relative: 31 %
Lymphs Abs: 2 10*3/uL (ref 0.7–4.0)
MCH: 30.3 pg (ref 26.0–34.0)
MCHC: 31.7 g/dL (ref 30.0–36.0)
MCV: 95.4 fL (ref 80.0–100.0)
Monocytes Absolute: 0.3 10*3/uL (ref 0.1–1.0)
Monocytes Relative: 4 %
Neutro Abs: 4 10*3/uL (ref 1.7–7.7)
Neutrophils Relative %: 62 %
Platelets: 232 10*3/uL (ref 150–400)
RBC: 4.79 MIL/uL (ref 3.87–5.11)
RDW: 14.1 % (ref 11.5–15.5)
WBC: 6.4 10*3/uL (ref 4.0–10.5)
nRBC: 0 % (ref 0.0–0.2)

## 2019-04-04 LAB — COMPREHENSIVE METABOLIC PANEL
ALT: 35 U/L (ref 0–44)
AST: 46 U/L — ABNORMAL HIGH (ref 15–41)
Albumin: 4.2 g/dL (ref 3.5–5.0)
Alkaline Phosphatase: 75 U/L (ref 38–126)
Anion gap: 11 (ref 5–15)
BUN: 17 mg/dL (ref 6–20)
CO2: 23 mmol/L (ref 22–32)
Calcium: 9.5 mg/dL (ref 8.9–10.3)
Chloride: 107 mmol/L (ref 98–111)
Creatinine, Ser: 0.72 mg/dL (ref 0.44–1.00)
GFR calc Af Amer: >60 mL/min (ref >60)
GFR calc non Af Amer: >60 mL/min (ref >60)
Glucose, Bld: 99 mg/dL (ref 70–99)
Potassium: 3.8 mmol/L (ref 3.5–5.1)
Sodium: 141 mmol/L (ref 135–145)
Total Bilirubin: 0.4 mg/dL (ref 0.3–1.2)
Total Protein: 7.6 g/dL (ref 6.5–8.1)

## 2019-04-04 LAB — D-DIMER, QUANTITATIVE: D-Dimer, Quant: 0.44 ug/mL-FEU (ref 0.00–0.50)

## 2019-04-04 LAB — LACTIC ACID, PLASMA
Lactic Acid, Venous: 0.9 mmol/L (ref 0.5–1.9)
Lactic Acid, Venous: 0.8 mmol/L (ref 0.5–1.9)

## 2019-04-04 LAB — FERRITIN: Ferritin: 98 ng/mL (ref 11–307)

## 2019-04-04 LAB — C-REACTIVE PROTEIN: CRP: <0.5 mg/dL (ref <1.0)

## 2019-04-04 LAB — FIBRINOGEN: Fibrinogen: 391 mg/dL (ref 210–475)

## 2019-04-04 LAB — PROCALCITONIN: Procalcitonin: <0.1 ng/mL

## 2019-04-04 LAB — BRAIN NATRIURETIC PEPTIDE: B Natriuretic Peptide: 31 pg/mL (ref 0.0–100.0)

## 2019-04-04 LAB — LACTATE DEHYDROGENASE: LDH: 143 U/L (ref 98–192)

## 2019-04-04 LAB — TRIGLYCERIDES: Triglycerides: 221 mg/dL — ABNORMAL HIGH (ref <150)

## 2019-04-04 MED ORDER — ALBUTEROL SULFATE HFA 108 (90 BASE) MCG/ACT IN AERS
2.0000 | INHALATION_SPRAY | Freq: Once | RESPIRATORY_TRACT | Status: AC
  Administered 2019-04-04: 2 via RESPIRATORY_TRACT
  Filled 2019-04-04: qty 6.7

## 2019-04-04 MED ORDER — PREDNISONE 10 MG PO TABS: 40.0000 mg | ORAL_TABLET | Freq: Every day | ORAL | 0 refills | Status: DC

## 2019-04-04 MED ORDER — DEXAMETHASONE SODIUM PHOSPHATE 10 MG/ML IJ SOLN
8.0000 mg | Freq: Once | INTRAMUSCULAR | Status: AC
  Administered 2019-04-04: 09:00:00 8 mg via INTRAVENOUS
  Filled 2019-04-04: qty 1

## 2019-04-04 MED ORDER — ACETAMINOPHEN 325 MG PO TABS
650.0000 mg | ORAL_TABLET | Freq: Once | ORAL | Status: AC
  Administered 2019-04-04: 650 mg via ORAL
  Filled 2019-04-04: qty 2

## 2019-04-04 NOTE — ED Triage Notes (Signed)
Pt brought in by [facility] with c/o SOB that started this morning at 0400. O2 sat at 81% on RA when EMS arrived. EMS reports lung sounds were minimal when they arrived. Pt was placed on NRB and O2 sat increased to 100%. Pt reports diarrhea since yesterday. EMS reports pt appears very dehydrated. Unable to gain IV access. Temp of 99.9 for EMS. Pt tested positive for Covid on Monday.

## 2019-04-04 NOTE — Discharge Instructions (Addendum)
Start prednisone tomorrow and take as prescribed.  Take albuterol inhaler every 4 hours or as needed for wheezing.  Your symptoms may worsen over the next week.  If you have any new or worsening symptoms, such as shortness of breath, chest pain, vomiting or any concerns at all please return to the ER immediately.

## 2019-04-04 NOTE — ED Provider Notes (Signed)
[facility] EMERGENCY DEPARTMENT Provider Note   CSN: [id number] Arrival date & time: 04/04/19  0755     History   Chief Complaint Chief Complaint  Patient presents with  . Shortness of Breath    Covid +    HPI Laura [name] is a 53 y.o. female.     HPI   53 year old female, daily smoker, presents with known COVID-19.  Patient was diagnosed on 12/2 with COVID-19.  She states she has not had significant symptoms until this morning.  She has had a dry cough.  Then this morning she started having significant shortness of breath.  Per EMS patient was satting 81% on room air.  She was given a breathing treatment in route.  She states that she is feeling significantly better after breathing treatment.  She denies any nausea, vomiting, fevers, chills.  She does note several episodes of diarrhea but denies any hematochezia or melena.  She denies any abdominal pain Or chest pain.  Past Medical History:  Diagnosis Date  . Anxiety   . Benign breast cyst in female   . Bronchitis 06/2013  . Otitis media 06/2013  . Pneumonia     Patient Active Problem List   Diagnosis Date Noted  . Postop check 06/04/2016  . Status post abdominal hysterectomy 05/18/2016  . PSVT (paroxysmal supraventricular tachycardia) (HCC) 12/04/2013  . Family history of ischemic heart disease 12/04/2013  . Tobacco use 12/04/2013    Past Surgical History:  Procedure Laterality Date  . ABDOMINAL HYSTERECTOMY N/A 05/18/2016   Procedure: HYSTERECTOMY ABDOMINAL;  Surgeon: Tilda Burrow, MD;  Location: [facility];  Service: Gynecology;  Laterality: N/A;  . BILATERAL SALPINGECTOMY N/A 05/18/2016   Procedure: BILATERAL SALPINGECTOMY;  Surgeon: Tilda Burrow, MD;  Location: [facility];  Service: Gynecology;  Laterality: N/A;  . BREAST SURGERY Right 2005   mass removed  . CESAREAN SECTION  [id number],[id number]  . TUBAL LIGATION       OB History    Gravida  2   Para  2   Term  2   Preterm      AB      Living  2     SAB      TAB      Ectopic      Multiple      Live Births  2            Home Medications    Prior to Admission medications   Medication Sig Start Date End Date Taking? Authorizing Provider  aspirin 325 MG EC tablet Take 325 mg by mouth daily.    [provider]    Family History Family History  Problem Relation Age of Onset  . CAD Father   . Lung cancer Paternal Grandfather        bone cancer  . Heart disease Paternal Grandmother   . Stomach cancer Maternal Grandmother   . Tremor Brother   . Parkinson's disease Brother   . Other Daughter        breast mass  . Asthma Son   . Other Son        Lyme's    Social History Social History   Tobacco Use  . Smoking status: Current Every Day Smoker    Packs/day: 1.00    Years: 30.00    Pack years: 30.00    Types: Cigarettes  . Smokeless tobacco: Never Used  Substance Use Topics  . Alcohol use: No  . Drug use:  No     Allergies   Penicillins and Sulfa antibiotics   Review of Systems Review of Systems  Constitutional: Negative for chills and fever.  HENT: Negative for rhinorrhea and sore throat.   Eyes: Negative for visual disturbance.  Respiratory: Positive for cough and shortness of breath.   Cardiovascular: Negative for chest pain and leg swelling.  Gastrointestinal: Positive for diarrhea. Negative for abdominal pain, nausea and vomiting.  Genitourinary: Negative for dysuria, frequency and urgency.  Musculoskeletal: Negative for joint swelling and neck pain.  Skin: Negative for rash and wound.  All other systems reviewed and are negative.    Physical Exam Updated Vital Signs BP (!) 107/59 (BP Location: Left Arm)   Pulse 63   Temp 98.2 F (36.8 C) (Oral)   Resp 20   Ht 5' 8.5" (1.74 m)   Wt 72.6 kg   LMP 05/11/2016   SpO2 100%   BMI 23.97 kg/m   Physical Exam Vitals signs and nursing note reviewed.  Constitutional:      Appearance: She is well-developed.  HENT:     Head: Normocephalic  and atraumatic.  Eyes:     Conjunctiva/sclera: Conjunctivae normal.  Neck:     Musculoskeletal: Neck supple.  Cardiovascular:     Rate and Rhythm: Normal rate and regular rhythm.     Heart sounds: Normal heart sounds. No murmur.  Pulmonary:     Effort: Pulmonary effort is normal. No respiratory distress.     Breath sounds: Examination of the right-lower field reveals decreased breath sounds and wheezing. Examination of the left-lower field reveals decreased breath sounds and wheezing. Decreased breath sounds and wheezing present. No rales.  Abdominal:     General: Bowel sounds are normal. There is no distension.     Palpations: Abdomen is soft.     Tenderness: There is no abdominal tenderness.  Musculoskeletal: Normal range of motion.        General: No tenderness or deformity.  Skin:    General: Skin is warm and dry.     Findings: No erythema or rash.  Neurological:     Mental Status: She is alert and oriented to person, place, and time.  Psychiatric:        Behavior: Behavior normal.      ED Treatments / Results  Labs (all labs ordered are listed, but only abnormal results are displayed) Labs Reviewed  COMPREHENSIVE METABOLIC PANEL - Abnormal; Notable for the following components:      Result Value   AST 46 (*)    All other components within normal limits  TRIGLYCERIDES - Abnormal; Notable for the following components:   Triglycerides 221 (*)    All other components within normal limits  CULTURE, BLOOD (ROUTINE X 2)  CULTURE, BLOOD (ROUTINE X 2)  LACTIC ACID, PLASMA  CBC WITH DIFFERENTIAL/PLATELET  LACTATE DEHYDROGENASE  LACTIC ACID, PLASMA  D-DIMER, QUANTITATIVE (NOT AT [facility])  PROCALCITONIN  FERRITIN  FIBRINOGEN  C-REACTIVE PROTEIN  BRAIN NATRIURETIC PEPTIDE  POC URINE PREG, ED    EKG None  Radiology No results found.  Procedures Procedures (including critical care time)  Medications Ordered in ED Medications  dexamethasone (DECADRON) injection 8 mg  (8 mg Intravenous Given 04/04/19 0835)  acetaminophen (TYLENOL) tablet 650 mg (650 mg Oral Given 04/04/19 0835)     Initial Impression / Assessment and Plan / ED Course  I have reviewed the triage vital signs and the nursing notes.  Pertinent labs & imaging results that were available during  my care of the patient were reviewed by me and considered in my medical decision making (see chart for details).        Patient presented with known COVID-19.  Per EMS she was satting 81% on room air on their arrival.  She was given an albuterol inhaler.  She has been observed in the ER for several hours.  She was taken off of O2 and is satting 98% or better.  She was ambulated on room air and her sats were between 98% and 100%.  Is feeling significantly better after albuterol inhaler.  She had no significant wheezing or increased work of breathing on my physical exam.  Her blood work is very reassuring.  She had a chest x-ray which shows no signs of pneumonia, pneumothorax, pleural effusion.  I will discharge the patient with steroids.  I have instructed her to start the steroids tomorrow given that she has had dexamethasone in the ED today.  No antibiotics indicated at this time.  Encouraged close monitoring of her symptoms.  She was given very strict return precautions and understands her symptoms may worsen before they improve.  She expressed understanding and is agreeable with plan for discharge.   At this time there does not appear to be any evidence of an acute emergency medical condition and the patient appears stable for discharge with appropriate outpatient follow up.Diagnosis was discussed with patient who verbalizes understanding and is agreeable to discharge. Pt case discussed with Dr. Reather Converse who agrees with my plan.   Final Clinical Impressions(s) / ED Diagnoses   Final diagnoses:  None    ED Discharge Orders    None       Rachel Moulds 04/04/19 1629    Elnora Morrison,  MD 04/05/19 1616

## 2019-04-04 NOTE — ED Notes (Signed)
Pt walked in the room she was 98% on ra and her o2 went up to 100% while walking Pa notified of this

## 2019-04-09 LAB — CULTURE, BLOOD (ROUTINE X 2)
Culture: NO GROWTH
Culture: NO GROWTH
Special Requests: ADEQUATE
Special Requests: ADEQUATE

## 2019-07-20 ENCOUNTER — Ambulatory Visit: Payer: Self-pay | Attending: Internal Medicine

## 2019-07-20 ENCOUNTER — Ambulatory Visit: Payer: BC Managed Care – PPO | Attending: Internal Medicine

## 2019-07-20 DIAGNOSIS — Z23 Encounter for immunization: Secondary | ICD-10-CM

## 2019-07-20 NOTE — Progress Notes (Signed)
   Covid-19 Vaccination Clinic  Name:  Laura [name]    MRN: [id number] DOB: Jan 01, 1966  07/20/2019  [name] was observed post Covid-19 immunization for 15 minutes without incident. She was provided with Vaccine Information Sheet and instruction to access the V-Safe system.   [name] was instructed to call 911 with any severe reactions post vaccine: Marland Kitchen Difficulty breathing  . Swelling of face and throat  . A fast heartbeat  . A bad rash all over body  . Dizziness and weakness   Immunizations Administered    Name Date Dose VIS Date Route   Moderna COVID-19 Vaccine 07/20/2019  9:30 AM 0.5 mL 03/27/2019 Intramuscular   Manufacturer: Moderna   Lot: [id number]   NDC: 30092-330-07

## 2019-08-21 ENCOUNTER — Ambulatory Visit: Payer: Self-pay

## 2019-08-21 ENCOUNTER — Ambulatory Visit: Payer: BC Managed Care – PPO | Attending: Internal Medicine

## 2019-08-21 DIAGNOSIS — Z23 Encounter for immunization: Secondary | ICD-10-CM

## 2019-08-21 NOTE — Progress Notes (Signed)
   Covid-19 Vaccination Clinic  Name:  Laura [name]    MRN: [id number] DOB: May 19, 1965  08/21/2019  Ms. [name] was observed post Covid-19 immunization for 15 minutes without incident. She was provided with Vaccine Information Sheet and instruction to access the V-Safe system.   Ms. [name] was instructed to call 911 with any severe reactions post vaccine: Marland Kitchen Difficulty breathing  . Swelling of face and throat  . A fast heartbeat  . A bad rash all over body  . Dizziness and weakness   Immunizations Administered    Name Date Dose VIS Date Route   Moderna COVID-19 Vaccine 08/21/2019 11:00 AM 0.5 mL 03/2019 Intramuscular   Manufacturer: Moderna   Lot: [id number]   NDC: 08719-941-29

## 2019-10-17 ENCOUNTER — Other Ambulatory Visit (HOSPITAL_COMMUNITY): Payer: Self-pay | Admitting: Physician Assistant

## 2019-10-17 DIAGNOSIS — Z1231 Encounter for screening mammogram for malignant neoplasm of breast: Secondary | ICD-10-CM

## 2019-10-25 ENCOUNTER — Ambulatory Visit (HOSPITAL_COMMUNITY): Payer: BC Managed Care – PPO

## 2019-10-25 ENCOUNTER — Ambulatory Visit (HOSPITAL_COMMUNITY): Payer: Self-pay

## 2019-10-25 ENCOUNTER — Encounter (HOSPITAL_COMMUNITY): Payer: Self-pay

## 2021-03-29 ENCOUNTER — Other Ambulatory Visit: Payer: Self-pay

## 2021-03-29 ENCOUNTER — Encounter (HOSPITAL_COMMUNITY): Payer: Self-pay

## 2021-03-29 DIAGNOSIS — F17210 Nicotine dependence, cigarettes, uncomplicated: Secondary | ICD-10-CM | POA: Diagnosis not present

## 2021-03-29 DIAGNOSIS — R5383 Other fatigue: Secondary | ICD-10-CM | POA: Insufficient documentation

## 2021-03-29 DIAGNOSIS — R202 Paresthesia of skin: Secondary | ICD-10-CM | POA: Insufficient documentation

## 2021-03-29 DIAGNOSIS — R519 Headache, unspecified: Secondary | ICD-10-CM | POA: Diagnosis not present

## 2021-03-29 DIAGNOSIS — R42 Dizziness and giddiness: Secondary | ICD-10-CM | POA: Diagnosis present

## 2021-03-29 NOTE — ED Triage Notes (Addendum)
Pt reports feeling dizzy today around 5pm when walking in a store "I felt like the bottom of my feet were going numb" and states it happened again when she got home associated with a headache and nausea. Denies LOC. She reports she was started on Clindamycin on 12/2 due to recent dental procedure.

## 2021-03-30 ENCOUNTER — Emergency Department (HOSPITAL_COMMUNITY): Payer: BC Managed Care – PPO

## 2021-03-30 ENCOUNTER — Emergency Department (HOSPITAL_COMMUNITY)
Admission: EM | Admit: 2021-03-30 | Discharge: 2021-03-30 | Disposition: A | Discharge status: Home / Self Care | Payer: BC Managed Care – PPO | Attending: Emergency Medicine | Admitting: Emergency Medicine

## 2021-03-30 DIAGNOSIS — R42 Dizziness and giddiness: Secondary | ICD-10-CM

## 2021-03-30 DIAGNOSIS — R202 Paresthesia of skin: Secondary | ICD-10-CM

## 2021-03-30 LAB — CBC
HCT: 41 % (ref 36.0–46.0)
Hemoglobin: 13.1 g/dL (ref 12.0–15.0)
MCH: 29.7 pg (ref 26.0–34.0)
MCHC: 32 g/dL (ref 30.0–36.0)
MCV: 93 fL (ref 80.0–100.0)
Platelets: 237 10*3/uL (ref 150–400)
RBC: 4.41 MIL/uL (ref 3.87–5.11)
RDW: 13.3 % (ref 11.5–15.5)
WBC: 8.6 10*3/uL (ref 4.0–10.5)
nRBC: 0 % (ref 0.0–0.2)

## 2021-03-30 LAB — BASIC METABOLIC PANEL
Anion gap: 10 (ref 5–15)
BUN: 17 mg/dL (ref 6–20)
CO2: 22 mmol/L (ref 22–32)
Calcium: 8.9 mg/dL (ref 8.9–10.3)
Chloride: 108 mmol/L (ref 98–111)
Creatinine, Ser: 0.73 mg/dL (ref 0.44–1.00)
GFR, Estimated: >60 mL/min (ref >60)
Glucose, Bld: 138 mg/dL — ABNORMAL HIGH (ref 70–99)
Potassium: 3.2 mmol/L — ABNORMAL LOW (ref 3.5–5.1)
Sodium: 140 mmol/L (ref 135–145)

## 2021-03-30 LAB — URINALYSIS, ROUTINE W REFLEX MICROSCOPIC
Bilirubin Urine: NEGATIVE
Glucose, UA: NEGATIVE mg/dL
Hgb urine dipstick: NEGATIVE
Ketones, ur: NEGATIVE mg/dL
Leukocytes,Ua: NEGATIVE
Nitrite: NEGATIVE
Protein, ur: NEGATIVE mg/dL
Specific Gravity, Urine: 1.025 (ref 1.005–1.030)
pH: 5.5 (ref 5.0–8.0)

## 2021-03-30 LAB — POC URINE PREG, ED: Preg Test, Ur: NEGATIVE

## 2021-03-30 LAB — CBG MONITORING, ED: Glucose-Capillary: 114 mg/dL — ABNORMAL HIGH (ref 70–99)

## 2021-03-30 NOTE — ED Notes (Signed)
Patient transported to CT 

## 2021-03-30 NOTE — ED Provider Notes (Signed)
[facility] Emergency Department Provider Note MRN:  [id number]  Arrival date & time: 03/30/21     Chief Complaint   Dizziness   History of Present Illness   Laura [name] is a 55 y.o. year-old female with no pertinent medical history presenting to the ED with chief complaint of dizziness.  Dizziness described as lightheadedness at work today.  Patient was on her feet all day at work, and then she felt lightheaded.  Felt fatigued.  Felt a tingling sensation in the bilateral feet.  No arm symptoms.  All symptoms improved when she sat down to rest.  She went home and had a dull frontal headache and took a nap.  No vision change, no speech change, no trouble swallowing, no neck or back pain, no chest pain or shortness of breath, no abdominal pain, no numbness or weakness to the arms or legs.  Paresthesias to the feet lasted only a few moments.  Review of Systems  A complete 10 system review of systems was obtained and all systems are negative except as noted in the HPI and PMH.   Patient's Health History    Past Medical History:  Diagnosis Date   Anxiety    Benign breast cyst in female    Bronchitis 06/2013   Otitis media 06/2013   Pneumonia     Past Surgical History:  Procedure Laterality Date   ABDOMINAL HYSTERECTOMY N/A 05/18/2016   Procedure: HYSTERECTOMY ABDOMINAL;  Surgeon: Tilda Burrow, MD;  Location: [facility];  Service: Gynecology;  Laterality: N/A;   BILATERAL SALPINGECTOMY N/A 05/18/2016   Procedure: BILATERAL SALPINGECTOMY;  Surgeon: Tilda Burrow, MD;  Location: [facility];  Service: Gynecology;  Laterality: N/A;   BREAST SURGERY Right 2005   mass removed   CESAREAN SECTION  [id number]   TUBAL LIGATION      Family History  Problem Relation Age of Onset   CAD Father    Lung cancer Paternal Grandfather        bone cancer   Heart disease Paternal Grandmother    Stomach cancer Maternal Grandmother    Tremor Brother    Parkinson's disease Brother     Other Daughter        breast mass   Asthma Son    Other Son        Lyme's    Social History   Socioeconomic History   Marital status: Divorced    Spouse name: Not on file   Number of children: Not on file   Years of education: Not on file   Highest education level: Not on file  Occupational History   Not on file  Tobacco Use   Smoking status: Every Day    Packs/day: 1.00    Years: 30.00    Pack years: 30.00    Types: Cigarettes   Smokeless tobacco: Never  Substance and Sexual Activity   Alcohol use: No   Drug use: No   Sexual activity: Yes    Birth control/protection: Surgical    Comment: tubal,hysterctomy  Other Topics Concern   Not on file  Social History Narrative   Not on file   Social Determinants of Health   Financial Resource Strain: Not on file  Food Insecurity: Not on file  Transportation Needs: Not on file  Physical Activity: Not on file  Stress: Not on file  Social Connections: Not on file  Intimate Partner Violence: Not on file     Physical Exam   Vitals:  03/30/21 0330 03/30/21 0340  BP:  115/78  Pulse: (!) 57 (!) 55  Resp: 20 12  Temp:  98.1 F (36.7 C)  SpO2: 98% 98%    CONSTITUTIONAL: Well-appearing, NAD NEURO:  Alert and oriented x 3, normal and symmetric strength and sensation, normal coordination, normal speech EYES:  eyes equal and reactive ENT/NECK:  no LAD, no JVD CARDIO: Regular rate, well-perfused, normal S1 and S2 PULM:  CTAB no wheezing or rhonchi GI/GU:  normal bowel sounds, non-distended, non-tender MSK/SPINE:  No gross deformities, no edema SKIN:  no rash, atraumatic PSYCH:  Appropriate speech and behavior  *Additional and/or pertinent findings included in MDM below  Diagnostic and Interventional Summary    EKG Interpretation  Date/Time:  Monday March 30 2021 03:25:39 EST Ventricular Rate:  51 PR Interval:  161 QRS Duration: 94 QT Interval:  448 QTC Calculation: 413 R Axis:   64 Text  Interpretation: Sinus rhythm Confirmed by Kennis Carina [phone]) on 03/30/2021 4:13:28 AM       Labs Reviewed  BASIC METABOLIC PANEL - Abnormal; Notable for the following components:      Result Value   Potassium 3.2 (*)    Glucose, Bld 138 (*)    All other components within normal limits  CBG MONITORING, ED - Abnormal; Notable for the following components:   Glucose-Capillary 114 (*)    All other components within normal limits  CBC  URINALYSIS, ROUTINE W REFLEX MICROSCOPIC  POC URINE PREG, ED    CT HEAD WO CONTRAST ( )  Final Result      Medications - No data to display   Procedures  /  Critical Care Procedures  ED Course and Medical Decision Making  I have reviewed the triage vital signs, the nursing notes, and pertinent available records from the EMR.  Listed above are laboratory and imaging tests that I personally ordered, reviewed, and interpreted and then considered in my medical decision making (see below for details).  Lightheadedness at work.  Seems to be related to being on her feet all day.  Paresthesias to the feet but no numbness or weakness, no strokelike symptoms here in the emergency department.  Did have a headache after work which is abnormal for her.  Screening CT head is normal.  Remainder of work-up reassuring, appropriate for discharge.       Elmer Sow. Pilar Plate, MD [facility] Emergency Medicine [facility] [email or web address]  Final Clinical Impressions(s) / ED Diagnoses     ICD-10-CM   1. Lightheadedness  R42     2. Paresthesia of both feet  R20.2       ED Discharge Orders     None        Discharge Instructions Discussed with and Provided to Patient:    Discharge Instructions      You were evaluated in the Emergency Department and after careful evaluation, we did not find any emergent condition requiring admission or further testing in the hospital.  Your exam/testing today was overall reassuring.  Your CT scan did  not show any abnormalities of your brain.  Recommend close follow-up with your primary care doctor.  Please return to the Emergency Department if you experience any worsening of your condition.  Thank you for allowing [place] to be a part of your care.        Sabas Sous, MD 03/30/21 [phone]

## 2021-03-30 NOTE — Discharge Instructions (Signed)
You were evaluated in the Emergency Department and after careful evaluation, we did not find any emergent condition requiring admission or further testing in the hospital.  Your exam/testing today was overall reassuring.  Your CT scan did not show any abnormalities of your brain.  Recommend close follow-up with your primary care doctor.  Please return to the Emergency Department if you experience any worsening of your condition.  Thank you for allowing [place] to be a part of your care.

## 2021-11-30 ENCOUNTER — Emergency Department (HOSPITAL_COMMUNITY): Payer: BC Managed Care – PPO

## 2021-11-30 ENCOUNTER — Emergency Department (HOSPITAL_COMMUNITY)
Admission: EM | Admit: 2021-11-30 | Discharge: 2021-11-30 | Disposition: A | Discharge status: Home / Self Care | Payer: BC Managed Care – PPO | Attending: Emergency Medicine | Admitting: Emergency Medicine

## 2021-11-30 ENCOUNTER — Encounter (HOSPITAL_COMMUNITY): Payer: Self-pay

## 2021-11-30 ENCOUNTER — Other Ambulatory Visit: Payer: Self-pay

## 2021-11-30 DIAGNOSIS — J209 Acute bronchitis, unspecified: Secondary | ICD-10-CM | POA: Diagnosis not present

## 2021-11-30 DIAGNOSIS — R0602 Shortness of breath: Secondary | ICD-10-CM | POA: Diagnosis present

## 2021-11-30 DIAGNOSIS — F17200 Nicotine dependence, unspecified, uncomplicated: Secondary | ICD-10-CM | POA: Diagnosis not present

## 2021-11-30 DIAGNOSIS — Z7982 Long term (current) use of aspirin: Secondary | ICD-10-CM | POA: Diagnosis not present

## 2021-11-30 DIAGNOSIS — Z20822 Contact with and (suspected) exposure to covid-19: Secondary | ICD-10-CM | POA: Diagnosis not present

## 2021-11-30 DIAGNOSIS — H6983 Other specified disorders of Eustachian tube, bilateral: Secondary | ICD-10-CM

## 2021-11-30 DIAGNOSIS — H6993 Unspecified Eustachian tube disorder, bilateral: Secondary | ICD-10-CM | POA: Diagnosis not present

## 2021-11-30 LAB — SARS CORONAVIRUS 2 BY RT PCR: SARS Coronavirus 2 by RT PCR: NEGATIVE

## 2021-11-30 MED ORDER — AZITHROMYCIN 250 MG PO TABS
250.0000 mg | ORAL_TABLET | Freq: Every day | ORAL | 0 refills | Status: DC
Start: 2021-11-30 — End: 2023-04-07

## 2021-11-30 MED ORDER — PREDNISONE 10 MG PO TABS: ORAL_TABLET | ORAL | 0 refills | Status: DC

## 2021-11-30 NOTE — Discharge Instructions (Signed)
Complete the entire course of the antibiotics prescribed.  You have also been placed on a larger prednisone taper that hopefully will make a significant improvement in your symptoms.  Plan to follow-up with Dr. Elijah Birk if your ear symptoms persist.

## 2021-11-30 NOTE — ED Provider Notes (Signed)
[facility] EMERGENCY DEPARTMENT Provider Note   CSN: [id number] Arrival date & time: 11/30/21  1335     History  Chief Complaint  Patient presents with   Shortness of Breath   Cough   Nasal Congestion    Laura [name] is a 56 y.o. female with a history including PSVT, history of bronchitis and pneumonia, remains a 1 pack/day smoker presenting with an approximate 1 month history of shortness of breath in association with cough, which is sometimes productive of a very thick white sputum but states it is very difficult to expel the sputum, additional symptoms including bilateral ear pressure.  Initially she also had sinus congestion which has improved.  She has been on various medications including Sudafed, Flonase and was prescribed a course of prednisone about 3 weeks ago, none of the symptoms have improved with these treatments.  She denies fevers or chills, she has no chest pain, she does have wheezing which is worse with exertion, better at rest.  She denies nausea or vomiting, abdominal pain, she does have episodic hot flashes.  Reports her symptoms are similar to prior episodes of pneumonia except that with her prior pneumonia episodes she also had back pain which she does not have today.  The history is provided by the patient.       Home Medications Prior to Admission medications   Medication Sig Start Date End Date Taking? Authorizing Provider  azithromycin (ZITHROMAX) 250 MG tablet Take 1 tablet (250 mg total) by mouth daily. Take first 2 tablets together, then 1 every day until finished. 11/30/21  Yes Aubrianne Molyneux, Raynelle Fanning, PA-C  predniSONE (DELTASONE) 10 MG tablet 6, 5, 4, 3, 2 then 1 tablet by mouth daily for 6 days total. 11/30/21  Yes Loana Salvaggio, Raynelle Fanning, PA-C  aspirin 325 MG EC tablet Take 325 mg by mouth daily.    [provider]  gabapentin (NEURONTIN) 300 MG capsule Take 1 capsule by mouth 2 (two) times daily. 1 capsule in am and 2 capsules in evening 03/20/19   [provider]  tiZANidine (ZANAFLEX) 4 MG tablet Take 1-3 tablets by mouth at bedtime. 03/14/19   [provider]      Allergies    Penicillins and Sulfa antibiotics    Review of Systems   Review of Systems  Constitutional:  Negative for fever.  HENT:  Positive for congestion. Negative for ear discharge, ear pain, sinus pressure, sinus pain, sore throat and trouble swallowing.        Bilateral ear pressure without pain.  Eyes: Negative.   Respiratory:  Positive for cough and shortness of breath. Negative for chest tightness.   Cardiovascular:  Negative for chest pain.  Gastrointestinal:  Negative for abdominal pain and nausea.  Genitourinary: Negative.   Musculoskeletal:  Negative for arthralgias, joint swelling and neck pain.  Skin: Negative.  Negative for rash and wound.  Neurological:  Negative for dizziness, weakness, light-headedness, numbness and headaches.  Psychiatric/Behavioral: Negative.      Physical Exam Updated Vital Signs BP 110/67   Pulse 72   Temp 98.1 F (36.7 C) (Oral)   Resp 20   Ht 5\' 9"  (1.753 m)   Wt 78 kg   LMP 05/11/2016 Comment: serum pregnancy negative 05/14/2016  SpO2 96%   BMI 25.40 kg/m  Physical Exam Vitals and nursing note reviewed.  Constitutional:      Appearance: She is well-developed.  HENT:     Head: Normocephalic and atraumatic.     Right Ear:  No swelling or tenderness. No middle ear effusion. No mastoid tenderness. Tympanic membrane is not injected.     Left Ear: No swelling or tenderness.  No middle ear effusion. No mastoid tenderness. Tympanic membrane is not injected.     Ears:     Comments: Mild loss of landmarks bilaterally.  No erythema.  No effusion. Eyes:     Conjunctiva/sclera: Conjunctivae normal.  Cardiovascular:     Rate and Rhythm: Normal rate and regular rhythm.     Heart sounds: Normal heart sounds.  Pulmonary:     Effort: Pulmonary effort is normal.     Breath sounds: Decreased breath sounds present.  No wheezing, rhonchi or rales.  Abdominal:     General: Bowel sounds are normal.     Palpations: Abdomen is soft.     Tenderness: There is no abdominal tenderness.  Musculoskeletal:        General: Normal range of motion.     Cervical back: Normal range of motion.     Right lower leg: No edema.     Left lower leg: No edema.  Skin:    General: Skin is warm and dry.  Neurological:     Mental Status: She is alert.     ED Results / Procedures / Treatments   Labs (all labs ordered are listed, but only abnormal results are displayed) Labs Reviewed  SARS CORONAVIRUS 2 BY RT PCR    EKG None  Radiology DG Chest 2 View  Result Date: 11/30/2021 CLINICAL DATA:  Shortness of breath EXAM: CHEST - 2 VIEW COMPARISON:  04/04/2019 FINDINGS: Cardiac and mediastinal contours are within normal limits. No focal pulmonary opacity. No pleural effusion or pneumothorax. No acute osseous abnormality. Thoracic dextrocurvature. IMPRESSION: No acute cardiopulmonary process. Electronically Signed   By: Wiliam Ke M.D.   On: 11/30/2021 14:18    Procedures Procedures    Medications Ordered in ED Medications - No data to display  ED Course/ Medical Decision Making/ A&P                           Medical Decision Making Patient presenting with a 1 month history of symptoms suggesting eustachian tube dysfunction, not responding to OTC medications including Sudafed, Flonase, she has also been on a very short 4-day 20 mg prednisone dose which was ineffective as well.  Imaging and labs today are reassuring, she is COVID-negative, no pneumonia seen on her chest x-ray.  She does report intermittent wheezing although she is not wheezing on today's exam and her vital signs are stable including SPO2 of 96% and respiratory rate of 17, other vital signs are unremarkable as well.  She will be placed on bigger and longer prednisone taper, in addition will be given a course of Zithromax for suspected acute bronchitis.   She was given referral to ENT for further management especially if her suspected eustachian tube dysfunction symptoms persist.  She was given information regarding smoking hazards and cessation.  Amount and/or Complexity of Data Reviewed Labs: ordered.    Details: COVID-negative Radiology: ordered and independent interpretation performed.    Details: Reviewed, negative for pneumonia.           Final Clinical Impression(s) / ED Diagnoses Final diagnoses:  Acute bronchitis, unspecified organism  Dysfunction of both eustachian tubes    Rx / DC Orders ED Discharge Orders          Ordered    azithromycin (ZITHROMAX) 250 MG  tablet  Daily        11/30/21 1519    predniSONE (DELTASONE) 10 MG tablet        11/30/21 1519              Burgess Amor, PA-C 11/30/21 1525    Sloan Leiter, DO 12/03/21 [phone]

## 2021-11-30 NOTE — ED Triage Notes (Signed)
Pt reports increased SOB, cough, congestion, and ear fullness. Symptoms ongoing sine end of June.  Pt given steroids with worsening of symptoms.

## 2023-02-05 DIAGNOSIS — R404 Transient alteration of awareness: Secondary | ICD-10-CM | POA: Diagnosis not present

## 2023-02-05 DIAGNOSIS — Z87891 Personal history of nicotine dependence: Secondary | ICD-10-CM | POA: Diagnosis not present

## 2023-02-05 DIAGNOSIS — R42 Dizziness and giddiness: Secondary | ICD-10-CM | POA: Diagnosis not present

## 2023-02-05 DIAGNOSIS — H538 Other visual disturbances: Secondary | ICD-10-CM | POA: Diagnosis not present

## 2023-02-05 DIAGNOSIS — R200 Anesthesia of skin: Secondary | ICD-10-CM | POA: Diagnosis not present

## 2023-04-07 ENCOUNTER — Emergency Department (HOSPITAL_COMMUNITY)
Admission: EM | Admit: 2023-04-07 | Discharge: 2023-04-07 | Disposition: A | Discharge status: Home / Self Care | Payer: 59 | Attending: Emergency Medicine | Admitting: Emergency Medicine

## 2023-04-07 ENCOUNTER — Encounter (HOSPITAL_COMMUNITY): Payer: Self-pay | Admitting: *Deleted

## 2023-04-07 ENCOUNTER — Emergency Department (HOSPITAL_COMMUNITY): Payer: 59

## 2023-04-07 ENCOUNTER — Other Ambulatory Visit: Payer: Self-pay

## 2023-04-07 DIAGNOSIS — R079 Chest pain, unspecified: Secondary | ICD-10-CM | POA: Insufficient documentation

## 2023-04-07 DIAGNOSIS — F17210 Nicotine dependence, cigarettes, uncomplicated: Secondary | ICD-10-CM | POA: Insufficient documentation

## 2023-04-07 DIAGNOSIS — R0602 Shortness of breath: Secondary | ICD-10-CM | POA: Diagnosis not present

## 2023-04-07 DIAGNOSIS — Z7982 Long term (current) use of aspirin: Secondary | ICD-10-CM | POA: Diagnosis not present

## 2023-04-07 DIAGNOSIS — R61 Generalized hyperhidrosis: Secondary | ICD-10-CM | POA: Diagnosis not present

## 2023-04-07 DIAGNOSIS — R0789 Other chest pain: Secondary | ICD-10-CM | POA: Diagnosis not present

## 2023-04-07 DIAGNOSIS — R0600 Dyspnea, unspecified: Secondary | ICD-10-CM | POA: Insufficient documentation

## 2023-04-07 LAB — BASIC METABOLIC PANEL
Anion gap: 9 (ref 5–15)
BUN: 19 mg/dL (ref 6–20)
CO2: 25 mmol/L (ref 22–32)
Calcium: 9.8 mg/dL (ref 8.9–10.3)
Chloride: 106 mmol/L (ref 98–111)
Creatinine, Ser: 0.71 mg/dL (ref 0.44–1.00)
GFR, Estimated: >60 mL/min (ref >60)
Glucose, Bld: 105 mg/dL — ABNORMAL HIGH (ref 70–99)
Potassium: 4.3 mmol/L (ref 3.5–5.1)
Sodium: 140 mmol/L (ref 135–145)

## 2023-04-07 LAB — CBC
HCT: 45.9 % (ref 36.0–46.0)
Hemoglobin: 15.2 g/dL — ABNORMAL HIGH (ref 12.0–15.0)
MCH: 30 pg (ref 26.0–34.0)
MCHC: 33.1 g/dL (ref 30.0–36.0)
MCV: 90.5 fL (ref 80.0–100.0)
Platelets: 285 10*3/uL (ref 150–400)
RBC: 5.07 MIL/uL (ref 3.87–5.11)
RDW: 13.5 % (ref 11.5–15.5)
WBC: 11.2 10*3/uL — ABNORMAL HIGH (ref 4.0–10.5)
nRBC: 0 % (ref 0.0–0.2)

## 2023-04-07 LAB — TROPONIN I (HIGH SENSITIVITY)
Troponin I (High Sensitivity): 2 ng/L (ref <18)
Troponin I (High Sensitivity): 3 ng/L (ref <18)

## 2023-04-07 MED ORDER — ASPIRIN 81 MG PO CHEW: 81.0000 mg | CHEWABLE_TABLET | Freq: Every day | ORAL | 0 refills | Status: AC

## 2023-04-07 MED ORDER — ASPIRIN 325 MG PO TABS
325.0000 mg | ORAL_TABLET | Freq: Once | ORAL | Status: AC
Start: 2023-04-07 — End: 2023-04-07
  Administered 2023-04-07: 325 mg via ORAL
  Filled 2023-04-07: qty 1

## 2023-04-07 NOTE — Discharge Instructions (Signed)
It was a pleasure caring for you today in the emergency department.  Please consider smoking cessation.  Please follow-up with cardiology for further evaluation  Return to the Emergency Department if you have unusual chest pain, pressure, or discomfort, shortness of breath, nausea, vomiting, burping, heartburn, tingling upper body parts, sweating, cold, clammy skin, or racing heartbeat. Call 911 if you think you are having a heart attack. Follow cardiac diet - avoid fatty & fried foods, don't eat too much red meat, eat lots of fruits & vegetables, and dairy products should be low fat. Please lose weight if you are overweight. Become more active with walking, gardening, or any other activity that gets you to moving.   Please return to the emergency department immediately for any new or concerning symptoms, or if you get worse.  Please return to the emergency department for any worsening or worrisome symptoms.

## 2023-04-07 NOTE — ED Triage Notes (Signed)
Pt with mid CP with diaphoresis while at work.  SOB earlier.

## 2023-04-07 NOTE — ED Provider Notes (Signed)
[place] EMERGENCY DEPARTMENT AT [facility] Provider Note  CSN: [id number] Arrival date & time: 04/07/23 1516  Chief Complaint(s) Chest Pain  HPI Laura [name] is a 57 y.o. female with past medical history as below, significant for anxiety, daily tobacco use, SVT, family history of MI in father in early 30s, does not regularly seek primary care who presents to the ED with complaint of chest pain  Chest pain intermittently last 24 hours.  Initially began at rest while watching TV.  Described as sharp, stabbing pain midsternal, radiating to right shoulder, associate with diaphoresis and dyspnea.  Pain reduced in severity from the onset and lingered for around an hour and then resolved.  Recurrence of the pain this morning while at work x 2.  Similar symptoms of sharp chest pain with radiation to the shoulder with diaphoresis and dyspnea.  Pain resolved spontaneously.  Unable to identify alleviating or exacerbating factors.  No nausea or vomiting, no leg swelling, history of DVT or PE.  No illicit drug use.  She does smoke cigarettes.  Does not follow with cardiology.  Past Medical History Past Medical History:  Diagnosis Date   Anxiety    Benign breast cyst in female    Bronchitis 06/2013   Otitis media 06/2013   Pneumonia    Patient Active Problem List   Diagnosis Date Noted   Postop check 06/04/2016   Status post abdominal hysterectomy 05/18/2016   PSVT (paroxysmal supraventricular tachycardia) (HCC) 12/04/2013   Family history of ischemic heart disease 12/04/2013   Tobacco use 12/04/2013   Home Medication(s) Prior to Admission medications   Medication Sig Start Date End Date Taking? Authorizing Provider  aspirin EC 81 MG tablet Take 81 mg by mouth daily.   Yes [provider]  Pseudoeph-CPM-DM-APAP (TYLENOL COLD & FLU DAY/NIGHT PO) Take 2 tablets by mouth 2 (two) times daily as needed (cold and flu symptoms, congestion, pain).   Yes [provider]                                                                                                                                     Past Surgical History Past Surgical History:  Procedure Laterality Date   ABDOMINAL HYSTERECTOMY N/A 05/18/2016   Procedure: HYSTERECTOMY ABDOMINAL;  Surgeon: Tilda Burrow, MD;  Location: [facility];  Service: Gynecology;  Laterality: N/A;   BILATERAL SALPINGECTOMY N/A 05/18/2016   Procedure: BILATERAL SALPINGECTOMY;  Surgeon: Tilda Burrow, MD;  Location: [facility];  Service: Gynecology;  Laterality: N/A;   BREAST SURGERY Right 2005   mass removed   CESAREAN SECTION  4098,1191   TUBAL LIGATION     Family History Family History  Problem Relation Age of Onset   CAD Father    Lung cancer Paternal Grandfather        bone cancer   Heart disease Paternal Grandmother    Stomach cancer Maternal Grandmother  Tremor Brother    Parkinson's disease Brother    Other Daughter        breast mass   Asthma Son    Other Son        Lyme's    Social History Social History   Tobacco Use   Smoking status: Every Day    Current packs/day: 1.00    Average packs/day: 1 pack/day for 30.0 years (30.0 ttl pk-yrs)    Types: Cigarettes   Smokeless tobacco: Never  Substance Use Topics   Alcohol use: No   Drug use: No   Allergies Penicillins and Sulfa antibiotics  Review of Systems Review of Systems  Constitutional:  Positive for diaphoresis. Negative for chills and fever.  Eyes:  Negative for visual disturbance.  Respiratory:  Positive for shortness of breath.   Cardiovascular:  Positive for chest pain. Negative for palpitations.  Gastrointestinal:  Negative for abdominal pain and nausea.  Genitourinary:  Negative for difficulty urinating.  Musculoskeletal:  Negative for arthralgias.  Skin:  Negative for rash.  Neurological:  Negative for syncope and numbness.  All other systems reviewed and are negative.   Physical Exam Vital Signs  I have reviewed the triage  vital signs BP 116/66   Pulse 79   Temp 97.8 F (36.6 C) (Oral)   Resp 16   Ht 5\' 9"  (1.753 m)   Wt 74.4 kg   LMP 05/11/2016 Comment: serum pregnancy negative 05/14/2016  SpO2 93%   BMI 24.22 kg/m  Physical Exam Vitals and nursing note reviewed.  Constitutional:      General: She is not in acute distress.    Appearance: Normal appearance.  HENT:     Head: Normocephalic and atraumatic.     Right Ear: External ear normal.     Left Ear: External ear normal.     Nose: Nose normal.     Mouth/Throat:     Mouth: Mucous membranes are moist.     Dentition: Abnormal dentition.  Eyes:     General: No scleral icterus.       Right eye: No discharge.        Left eye: No discharge.  Cardiovascular:     Rate and Rhythm: Normal rate and regular rhythm.     Pulses: Normal pulses.     Heart sounds: Normal heart sounds.  Pulmonary:     Effort: Pulmonary effort is normal. No respiratory distress.     Breath sounds: Normal breath sounds. No stridor.  Abdominal:     General: Abdomen is flat. There is no distension.     Palpations: Abdomen is soft.     Tenderness: There is no abdominal tenderness.  Musculoskeletal:     Cervical back: No rigidity.     Right lower leg: No edema.     Left lower leg: No edema.  Skin:    General: Skin is warm and dry.     Capillary Refill: Capillary refill takes less than 2 seconds.  Neurological:     Mental Status: She is alert.  Psychiatric:        Mood and Affect: Mood normal.        Behavior: Behavior normal. Behavior is cooperative.     ED Results and Treatments Labs (all labs ordered are listed, but only abnormal results are displayed) Labs Reviewed  BASIC METABOLIC PANEL - Abnormal; Notable for the following components:      Result Value   Glucose, Bld 105 (*)    All other components  within normal limits  CBC - Abnormal; Notable for the following components:   WBC 11.2 (*)    Hemoglobin 15.2 (*)    All other components within normal limits   TROPONIN I (HIGH SENSITIVITY)  TROPONIN I (HIGH SENSITIVITY)                                                                                                                          Radiology DG Chest 2 View Result Date: 04/07/2023 CLINICAL DATA:  Chest pain associated with diaphoresis and shortness of breath EXAM: CHEST - 2 VIEW COMPARISON:  Chest radiograph dated 11/30/2021 FINDINGS: Normal lung volumes. No focal consolidations. No pleural effusion or pneumothorax. The heart size and mediastinal contours are within normal limits. No acute osseous abnormality. IMPRESSION: No active cardiopulmonary disease. Electronically Signed   By: Agustin Cree M.D.   On: 04/07/2023 16:15    Pertinent labs & imaging results that were available during my care of the patient were reviewed by me and considered in my medical decision making (see MDM for details).  Medications Ordered in ED Medications  aspirin tablet 325 mg (325 mg Oral Given 04/07/23 1701)                                                                                                                                     Procedures Procedures  (including critical care time)  Medical Decision Making / ED Course    Medical Decision Making:    Laura [name] is a 57 y.o. female  with past medical history as below, significant for anxiety, daily tobacco use, SVT, family history of MI in father in early 70s, does not regularly seek primary care who presents to the ED with complaint of chest pain. The complaint involves an extensive differential diagnosis and also carries with it a high risk of complications and morbidity.  Serious etiology was considered. Ddx includes but is not limited to: Differential includes all life-threatening causes for chest pain. This includes but is not exclusive to acute coronary syndrome, aortic dissection, pulmonary embolism, cardiac tamponade, community-acquired pneumonia, pericarditis, musculoskeletal chest wall  pain, etc.   Complete initial physical exam performed, notably the patient was in no acute distress, currently symptomatic.    Reviewed and confirmed nursing documentation for past medical history, family history, social history.  Vital signs reviewed.     Clinical Course as of  04/07/23 1857  Thu Apr 07, 2023  1743 Spoke with cardiology Dr Jerene Pitch, recommends f/u delta trop, plan o/p eval if delta trop neg. F/u in office [SG]  1837 Troponin I (High Sensitivity): 3 Delta trop flat [SG]    Clinical Course User Index [SG] Sloan Leiter, DO    Brief summary: 57 year old female history as above, does not have primary care here with chest pain over the last 24 hours.  Sudden onset, sharp stabbing, radiating to shoulder, associate with dyspnea and diaphoresis.  Resolve spontaneously.  Denies similar symptoms in the past.  HEART score is 4 (tobacco, hx TIA, family hx)   Troponin negative x2. CXR reviewed. Labs without demonstration of acute pathology unless otherwise noted above. MODERATE heart score.   Discussed with cardiology, recommend outpatient follow-up in the office, CT coronary.  Discussed with patient at bedside.  She is symptom-free.  Will start baby aspirin.  She will consider smoking cessation.  Given the extremely low risk of these diagnoses further testing and evaluation for these possibilities does not appear to be indicated at this time. Patient in no distress and overall condition improved here in the ED. Detailed discussions were had with the patient regarding current findings, and need for close f/u with PCP or on call doctor. The patient has been instructed to return immediately if the symptoms worsen in any way for re-evaluation. Patient verbalized understanding and is in agreement with current care plan. All questions answered prior to discharge.           Additional history obtained: -Additional history obtained from na -External records from outside source  obtained and reviewed including: Chart review including previous notes, labs, imaging, consultation notes including  Prior ER visits, primary care documentation   Lab Tests: -I ordered, reviewed, and interpreted labs.   The pertinent results include:   Labs Reviewed  BASIC METABOLIC PANEL - Abnormal; Notable for the following components:      Result Value   Glucose, Bld 105 (*)    All other components within normal limits  CBC - Abnormal; Notable for the following components:   WBC 11.2 (*)    Hemoglobin 15.2 (*)    All other components within normal limits  TROPONIN I (HIGH SENSITIVITY)  TROPONIN I (HIGH SENSITIVITY)    Notable for stable labs  EKG   EKG Interpretation Date/Time:  Thursday April 07 2023 15:25:43 EST Ventricular Rate:  96 PR Interval:  136 QRS Duration:  76 QT Interval:  346 QTC Calculation: 437 R Axis:   65  Text Interpretation: Normal sinus rhythm Right atrial enlargement Septal infarct , age undetermined Abnormal ECG When compared with ECG of 30-Nov-2021 13:44, PREVIOUS ECG IS PRESENT similar to prior Confirmed by Tanda Rockers ([id number]) on 04/07/2023 4:59:37 PM         Imaging Studies ordered: I ordered imaging studies including CXR I independently visualized the following imaging with scope of interpretation limited to determining acute life threatening conditions related to emergency care; findings noted above I independently visualized and interpreted imaging. I agree with the radiologist interpretation   Medicines ordered and prescription drug management: Meds ordered this encounter  Medications   aspirin tablet 325 mg    -I have reviewed the patients home medicines and have made adjustments as needed   Consultations Obtained: I requested consultation with the cardiology,  and discussed lab and imaging findings as well as pertinent plan - they recommend: f/u office   Cardiac Monitoring: The patient was maintained  on a cardiac monitor.   I personally viewed and interpreted the cardiac monitored which showed an underlying rhythm of: NSR Continuous pulse oximetry interpreted by myself, 99% on RA.    Social Determinants of Health:  Diagnosis or treatment significantly limited by social determinants of health: current smoker, no pcp Counseled patient for approximately 3 minutes regarding smoking cessation. Discussed risks of smoking and how they applied and affected their visit here today. Patient not ready to quit at this time, however will follow up with their primary doctor when they are.   CPT code: 03474: intermediate counseling for smoking cessation     Reevaluation: After the interventions noted above, I reevaluated the patient and found that they have resolved  Co morbidities that complicate the patient evaluation  Past Medical History:  Diagnosis Date   Anxiety    Benign breast cyst in female    Bronchitis 06/2013   Otitis media 06/2013   Pneumonia       Dispostion: Disposition decision including need for hospitalization was considered, and patient discharged from emergency department.    Final Clinical Impression(s) / ED Diagnoses Final diagnoses:  Moderate risk chest pain        Sloan Leiter, DO 04/07/23 1857

## 2023-04-19 ENCOUNTER — Ambulatory Visit: Payer: BC Managed Care – PPO | Admitting: Internal Medicine

## 2023-05-04 ENCOUNTER — Ambulatory Visit: Payer: No Typology Code available for payment source | Attending: Nurse Practitioner | Admitting: Nurse Practitioner

## 2023-05-04 ENCOUNTER — Encounter: Payer: Self-pay | Admitting: Nurse Practitioner

## 2023-05-04 ENCOUNTER — Encounter: Payer: Self-pay | Admitting: *Deleted

## 2023-05-04 VITALS — BP 128/72 | HR 84 | Ht 69.0 in | Wt 162.6 lb

## 2023-05-04 DIAGNOSIS — Z8249 Family history of ischemic heart disease and other diseases of the circulatory system: Secondary | ICD-10-CM

## 2023-05-04 DIAGNOSIS — Z01812 Encounter for preprocedural laboratory examination: Secondary | ICD-10-CM

## 2023-05-04 DIAGNOSIS — R42 Dizziness and giddiness: Secondary | ICD-10-CM | POA: Diagnosis not present

## 2023-05-04 DIAGNOSIS — Z1329 Encounter for screening for other suspected endocrine disorder: Secondary | ICD-10-CM

## 2023-05-04 DIAGNOSIS — R079 Chest pain, unspecified: Secondary | ICD-10-CM | POA: Diagnosis not present

## 2023-05-04 DIAGNOSIS — Z758 Other problems related to medical facilities and other health care: Secondary | ICD-10-CM

## 2023-05-04 DIAGNOSIS — Z720 Tobacco use: Secondary | ICD-10-CM | POA: Diagnosis not present

## 2023-05-04 DIAGNOSIS — Z13220 Encounter for screening for lipoid disorders: Secondary | ICD-10-CM | POA: Diagnosis not present

## 2023-05-04 DIAGNOSIS — Z79899 Other long term (current) drug therapy: Secondary | ICD-10-CM | POA: Diagnosis not present

## 2023-05-04 MED ORDER — METOPROLOL TARTRATE 100 MG PO TABS: 100.0000 mg | ORAL_TABLET | Freq: Once | ORAL | 0 refills | Status: DC

## 2023-05-04 MED ORDER — NITROGLYCERIN 0.4 MG SL SUBL: 0.4000 mg | SUBLINGUAL_TABLET | SUBLINGUAL | 3 refills | Status: AC | PRN

## 2023-05-04 NOTE — Patient Instructions (Addendum)
 Medication Instructions:   Nitroglycerin  as needed for chest pain  Continue all other medications.     Labwork:  CMET, CBC, Mg, FLP, Thyroid  Panel  Reminder:  Nothing to eat or drink after 12 midnight prior to labs. Do just prior to CT   Testing/Procedures:  Coronary CTA   Follow-Up:  Office will contact with results via phone, letter or mychart.    6-8 weeks - peck   Any Other Special Instructions Will Be Listed Below (If Applicable).   If you need a refill on your cardiac medications before your next appointment, please call your pharmacy.

## 2023-05-04 NOTE — Progress Notes (Signed)
 Cardiology Office Note:  .   Date:  05/04/2023 ID:  Laura [name], DOB 1966-01-03, MRN [id number] PCP: [facility] Providers Cardiologist:  Diannah SHAUNNA Maywood, MD    History of Present Illness: .   Laura [name] is a 58 y.o. female with a PMH of PSVT, tobacco use, anxiety, and family history of CAD, who presents today for follow-up ED appointment.  Hospitalized in 2015 for palpitations.  Her symptoms were felt to be due to most consistent with SVT or paroxysmal atrial tachycardia.  TEE in 2015 showed normal EF.  ED visit at [facility] on April 07, 2023 for chest pain.  Described as sudden onset, sharp/stabbing, radiating to shoulder and associated with diaphoresis and dyspnea, episode resolved spontaneously.  Ruled out for ACS, workup unremarkable.  Consulted Dr. Kate who recommended to follow-up in the office.  Today she presents for follow-up. She admits to intermittent central, sharp chest pain that has been ongoing for the past 4 months, sometimes located under bilateral breast tissue. Says she has had 2 episodes since ED visit, episodes wake her up in the middle of the night. Episodes are brief in durations, denies any aggravating factors/triggers. CP episodes remain stable over time. Does admit to some lightheadedness/dizziness at times without warning, denies any specific trigger as well. Denies any shortness of breath, palpitations, syncope, presyncope, orthopnea, PND, swelling or significant weight changes, acute bleeding, or claudication.   ROS: Negative. See HPI.   SH: Works at [employer], smokes 1/2 PPD, working to wean herself off Nicotine .   Studies Reviewed: .    Echo 11/2013:  Study Conclusions   - Left ventricle: The cavity size was normal. Wall thickness was    normal. Systolic function was normal. The estimated ejection    fraction was in the range of 60% to 65%. Wall motion was normal;    there were no regional wall motion  abnormalities. Left    ventricular diastolic function parameters were normal.  - Aortic valve: Poorly visualized. Probably trileaflet; mildly    calcified leaflets.  - Mitral valve: There was no regurgitation.  - Right ventricle: The cavity size was normal. Systolic function    was normal.  - Pulmonary arteries: No complete TR doppler jet so unable to    estimate PA systolic pressure.  - Inferior vena cava: The vessel was normal in size. The    respirophasic diameter changes were in the normal range (>= 50%),    consistent with normal central venous pressure.   Physical Exam:   VS:  BP 128/72   Pulse 84   Ht 5' 9 (1.753 m)   Wt 162 lb 9.6 oz (73.8 kg)   LMP 05/11/2016 Comment: serum pregnancy negative 05/14/2016  SpO2 96%   BMI 24.01 kg/m    Wt Readings from Last 3 Encounters:  05/04/23 162 lb 9.6 oz (73.8 kg)  04/07/23 164 lb (74.4 kg)  11/30/21 172 lb (78 kg)    GEN: Well nourished, well developed in no acute distress NECK: No JVD; No carotid bruits CARDIAC: S1/S2, RRR, no murmurs, rubs, gallops RESPIRATORY:  Clear to auscultation without rales, wheezing or rhonchi  ABDOMEN: Soft, non-tender, non-distended EXTREMITIES:  No edema; No deformity   ASSESSMENT AND PLAN: .    Chest pain of uncertain etiology, family hx of ischemic heart disease, pre-procedure lab Atypical episodes, however she does have a strong family hx of early CAD. No recent ischemic evaluation has been performed. Will  arrange CCTA, arrange labs (CBC, CMET, and FLP), and Rx one time dose of Metoprolol  tartrate 100 mg to be taken 2 hours prior to testing. Will provide Rx for NTG PRN. Instructed regarding this medication and she verbalizes understanding. Heart healthy diet and regular cardiovascular exercise encouraged. Care and ED precautions discussed. Continue ASA 81 mg daily.   Screening for hyperlipidemia Does not have a PCP. Will obtain FLP in addition to labs as mentioned above.   Screening for thyroid   disorder Does not have a PCP and thyroid  disease runs in her family. Will obtain thyroid  panel in addition to labs as mentioned above.   Dizziness, medication management  Etiology unclear. Does not sound vertigo/orthostatic related. Will arrange carotid duplex and obtain Magnesium level in addition to labs as mentioned above. Will provide referral to PCP.   Tobacco use Smoking cessation encouraged and discussed.   Does not have a PCP Will provide referral to PCP.    Dispo: Follow-up with me/[facility] in 6-8 weeks or sooner if anything changes.   Signed, Almarie Crate, NP

## 2023-05-09 DIAGNOSIS — L308 Other specified dermatitis: Secondary | ICD-10-CM | POA: Diagnosis not present

## 2023-05-09 DIAGNOSIS — Z1283 Encounter for screening for malignant neoplasm of skin: Secondary | ICD-10-CM | POA: Diagnosis not present

## 2023-05-09 DIAGNOSIS — D485 Neoplasm of uncertain behavior of skin: Secondary | ICD-10-CM | POA: Diagnosis not present

## 2023-05-09 DIAGNOSIS — D225 Melanocytic nevi of trunk: Secondary | ICD-10-CM | POA: Diagnosis not present

## 2023-05-09 DIAGNOSIS — L989 Disorder of the skin and subcutaneous tissue, unspecified: Secondary | ICD-10-CM | POA: Diagnosis not present

## 2023-05-16 ENCOUNTER — Ambulatory Visit: Payer: No Typology Code available for payment source | Attending: Nurse Practitioner

## 2023-05-16 DIAGNOSIS — I6523 Occlusion and stenosis of bilateral carotid arteries: Secondary | ICD-10-CM

## 2023-05-16 DIAGNOSIS — R42 Dizziness and giddiness: Secondary | ICD-10-CM

## 2023-05-19 ENCOUNTER — Encounter (HOSPITAL_COMMUNITY): Payer: Self-pay

## 2023-05-19 ENCOUNTER — Other Ambulatory Visit (HOSPITAL_COMMUNITY)
Admission: RE | Admit: 2023-05-19 | Discharge: 2023-05-19 | Disposition: A | Discharge status: Home / Self Care | Payer: No Typology Code available for payment source | Source: Ambulatory Visit | Attending: Nurse Practitioner | Admitting: Nurse Practitioner

## 2023-05-19 DIAGNOSIS — R42 Dizziness and giddiness: Secondary | ICD-10-CM | POA: Insufficient documentation

## 2023-05-19 DIAGNOSIS — Z01812 Encounter for preprocedural laboratory examination: Secondary | ICD-10-CM | POA: Diagnosis not present

## 2023-05-19 DIAGNOSIS — R079 Chest pain, unspecified: Secondary | ICD-10-CM | POA: Diagnosis not present

## 2023-05-19 DIAGNOSIS — Z79899 Other long term (current) drug therapy: Secondary | ICD-10-CM | POA: Insufficient documentation

## 2023-05-19 LAB — CBC
HCT: 43.4 % (ref 36.0–46.0)
Hemoglobin: 14 g/dL (ref 12.0–15.0)
MCH: 29.5 pg (ref 26.0–34.0)
MCHC: 32.3 g/dL (ref 30.0–36.0)
MCV: 91.6 fL (ref 80.0–100.0)
Platelets: 247 10*3/uL (ref 150–400)
RBC: 4.74 MIL/uL (ref 3.87–5.11)
RDW: 13.4 % (ref 11.5–15.5)
WBC: 7.6 10*3/uL (ref 4.0–10.5)
nRBC: 0 % (ref 0.0–0.2)

## 2023-05-19 LAB — COMPREHENSIVE METABOLIC PANEL
ALT: 13 U/L (ref 0–44)
AST: 26 U/L (ref 15–41)
Albumin: 3.9 g/dL (ref 3.5–5.0)
Alkaline Phosphatase: 68 U/L (ref 38–126)
Anion gap: 7 (ref 5–15)
BUN: 16 mg/dL (ref 6–20)
CO2: 26 mmol/L (ref 22–32)
Calcium: 9.4 mg/dL (ref 8.9–10.3)
Chloride: 107 mmol/L (ref 98–111)
Creatinine, Ser: 0.66 mg/dL (ref 0.44–1.00)
GFR, Estimated: >60 mL/min (ref >60)
Glucose, Bld: 95 mg/dL (ref 70–99)
Potassium: 4.8 mmol/L (ref 3.5–5.1)
Sodium: 140 mmol/L (ref 135–145)
Total Bilirubin: 0.2 mg/dL (ref 0.0–1.2)
Total Protein: 6.6 g/dL (ref 6.5–8.1)

## 2023-05-19 LAB — LIPID PANEL
Cholesterol: 156 mg/dL (ref 0–200)
HDL: 33 mg/dL — ABNORMAL LOW (ref >40)
LDL Cholesterol: 92 mg/dL (ref 0–99)
Total CHOL/HDL Ratio: 4.7 {ratio}
Triglycerides: 156 mg/dL — ABNORMAL HIGH (ref <150)
VLDL: 31 mg/dL (ref 0–40)

## 2023-05-19 LAB — MAGNESIUM: Magnesium: 2.3 mg/dL (ref 1.7–2.4)

## 2023-05-20 ENCOUNTER — Telehealth (HOSPITAL_COMMUNITY): Payer: Self-pay | Admitting: Emergency Medicine

## 2023-05-20 LAB — THYROID PANEL WITH TSH
Free Thyroxine Index: 2.6 (ref 1.2–4.9)
T3 Uptake Ratio: 29 % (ref 24–39)
T4, Total: 9 ug/dL (ref 4.5–12.0)
TSH: 1.87 u[IU]/mL (ref 0.450–4.500)

## 2023-05-20 NOTE — Telephone Encounter (Signed)
Reaching out to patient to offer assistance regarding upcoming cardiac imaging study; pt verbalizes understanding of appt date/time, parking situation and where to check in, pre-test NPO status and medications ordered, and verified current allergies; name and call back number provided for further questions should they arise Rockwell Alexandria RN Navigator Cardiac Imaging [facility] [phone] office [phone] cell

## 2023-05-23 ENCOUNTER — Ambulatory Visit (HOSPITAL_COMMUNITY)
Admission: RE | Admit: 2023-05-23 | Discharge: 2023-05-23 | Disposition: A | Discharge status: Home / Self Care | Payer: No Typology Code available for payment source | Source: Ambulatory Visit | Attending: Nurse Practitioner | Admitting: Nurse Practitioner

## 2023-05-23 DIAGNOSIS — R079 Chest pain, unspecified: Secondary | ICD-10-CM | POA: Insufficient documentation

## 2023-05-23 MED ORDER — NITROGLYCERIN 0.4 MG SL SUBL
0.8000 mg | SUBLINGUAL_TABLET | Freq: Once | SUBLINGUAL | Status: AC
Start: 2023-05-23 — End: 2023-05-23
  Administered 2023-05-23: 0.8 mg via SUBLINGUAL

## 2023-05-23 MED ORDER — DILTIAZEM HCL 25 MG/5ML IV SOLN: 10.0000 mg | INTRAVENOUS | Status: DC | PRN

## 2023-05-23 MED ORDER — NITROGLYCERIN 0.4 MG SL SUBL
SUBLINGUAL_TABLET | SUBLINGUAL | Status: AC
  Filled 2023-05-23: qty 2

## 2023-05-23 MED ORDER — IOHEXOL 350 MG/ML SOLN
95.0000 mL | Freq: Once | INTRAVENOUS | Status: AC | PRN
  Administered 2023-05-23: 95 mL via INTRAVENOUS

## 2023-05-23 MED ORDER — METOPROLOL TARTRATE 5 MG/5ML IV SOLN: 10.0000 mg | Freq: Once | INTRAVENOUS | Status: DC | PRN

## 2023-05-30 DIAGNOSIS — L988 Other specified disorders of the skin and subcutaneous tissue: Secondary | ICD-10-CM | POA: Diagnosis not present

## 2023-06-02 ENCOUNTER — Ambulatory Visit: Payer: BC Managed Care – PPO | Admitting: Internal Medicine

## 2023-06-27 ENCOUNTER — Encounter: Payer: Self-pay | Admitting: Nurse Practitioner

## 2023-06-27 ENCOUNTER — Ambulatory Visit: Payer: No Typology Code available for payment source | Attending: Nurse Practitioner | Admitting: Nurse Practitioner

## 2023-06-27 VITALS — BP 110/70 | HR 68 | Ht 69.0 in | Wt 159.0 lb

## 2023-06-27 DIAGNOSIS — I471 Supraventricular tachycardia, unspecified: Secondary | ICD-10-CM | POA: Diagnosis not present

## 2023-06-27 DIAGNOSIS — Z720 Tobacco use: Secondary | ICD-10-CM

## 2023-06-27 DIAGNOSIS — I6523 Occlusion and stenosis of bilateral carotid arteries: Secondary | ICD-10-CM | POA: Diagnosis not present

## 2023-06-27 DIAGNOSIS — Z8249 Family history of ischemic heart disease and other diseases of the circulatory system: Secondary | ICD-10-CM

## 2023-06-27 DIAGNOSIS — R079 Chest pain, unspecified: Secondary | ICD-10-CM | POA: Diagnosis not present

## 2023-06-27 NOTE — Progress Notes (Signed)
 Cardiology Office Note:  .   Date:  06/27/2023 ID:  Laura [name], DOB 01-12-1966, MRN [id number] PCP: [facility] Providers Cardiologist:  Laura Bicker, MD    History of Present Illness: .   Laura [name] is a 58 y.o. female with a PMH of PSVT, tobacco use, anxiety, family history of CAD, and carotid artery disease, who presents today for follow-up appointment.  Hospitalized in 2015 for palpitations.  Her symptoms were felt to be due to most consistent with SVT or paroxysmal atrial tachycardia.  TEE in 2015 showed normal EF.  ED visit at [facility] on April 07, 2023 for chest pain.  Described as sudden onset, sharp/stabbing, radiating to shoulder and associated with diaphoresis and dyspnea, episode resolved spontaneously.  Ruled out for ACS, workup unremarkable.  Consulted Dr. Bjorn [name] who recommended to follow-up in the office.  05/04/2023 - Today she presents for follow-up. She admits to intermittent central, sharp chest pain that has been ongoing for the past 4 months, sometimes located under bilateral breast tissue. Says she has had 2 episodes since ED visit, episodes wake her up in the middle of the night. Episodes are brief in durations, denies any aggravating factors/triggers. CP episodes remain stable over time. Does admit to some lightheadedness/dizziness at times without warning, denies any specific trigger as well. Denies any shortness of breath, palpitations, syncope, presyncope, orthopnea, PND, swelling or significant weight changes, acute bleeding, or claudication.   06/27/2023 -presents today for follow-up.  She has noted some intermittent chest pains since I last saw her.  She has been taking nitroglycerin to help with these episodes.  She says most recent episode happened at night. Denies any shortness of breath, palpitations, syncope, presyncope, dizziness, orthopnea, PND, swelling or significant weight changes, acute bleeding, or  claudication.  ROS: Negative. See HPI.   SH: Works at [employer], smokes 1/2 PPD, working to wean herself off Nicotine.   Studies Reviewed: Marland Kitchen    EKG: EKG is not ordered today.  Coronary CTA 05/2023: IMPRESSION: 1. No evidence of CAD, 0% stenosis, CADRADS 0.   2. Total plaque volume 0 mm3.   3. Coronary calcium score of 0.   4. Normal coronary origins with right dominance.   RECOMMENDATIONS: CAD-RADS 0. No evidence of CAD (0%). Consider non-atherosclerotic causes of chest pain.  Carotid duplex 04/2023: Summary:  Right Carotid: Velocities in the right ICA are consistent with a 1-39%  stenosis. Non-hemodynamically significant plaque <50% noted in the  CCA. The ECA appears <50% stenosed.   Left Carotid: Velocities in the left ICA are consistent with a 1-39%  stenosis. Non-hemodynamically significant plaque <50% noted in the  CCA. The ECA appears <50% stenosed.   Vertebrals:  Bilateral vertebral arteries demonstrate antegrade flow.  Subclavians: Normal flow hemodynamics were seen in bilateral subclavian arteries.   Echo 11/2013:  Study Conclusions   - Left ventricle: The cavity size was normal. Wall thickness was    normal. Systolic function was normal. The estimated ejection    fraction was in the range of 60% to 65%. Wall motion was normal;    there were no regional wall motion abnormalities. Left    ventricular diastolic function parameters were normal.  - Aortic valve: Poorly visualized. Probably trileaflet; mildly    calcified leaflets.  - Mitral valve: There was no regurgitation.  - Right ventricle: The cavity size was normal. Systolic function    was normal.  - Pulmonary arteries: No complete TR  doppler jet so unable to    estimate PA systolic pressure.  - Inferior vena cava: The vessel was normal in size. The    respirophasic diameter changes were in the normal range (>= 50%),    consistent with normal central venous pressure.   Physical Exam:   VS:  BP 110/70    Pulse 68   Ht 5\' 9"  (1.753 m)   Wt 159 lb (72.1 kg)   LMP 05/11/2016 Comment: serum pregnancy negative 05/14/2016  SpO2 98%   BMI 23.48 kg/m    Wt Readings from Last 3 Encounters:  06/27/23 159 lb (72.1 kg)  05/04/23 162 lb 9.6 oz (73.8 kg)  04/07/23 164 lb (74.4 kg)    GEN: Well nourished, well developed in no acute distress NECK: No JVD; No carotid bruits CARDIAC: S1/S2, RRR, no murmurs, rubs, gallops RESPIRATORY:  Clear to auscultation without rales, wheezing or rhonchi  ABDOMEN: Soft, non-tender, non-distended EXTREMITIES:  No edema; No deformity   ASSESSMENT AND PLAN: .    Chest pain of uncertain etiology, family hx of ischemic heart disease Atypical episodes, however she does have a strong family hx of early CAD.  Recent CCTA was negative for CAD. Will continue to monitor.  She tells me she is taking a baby aspirin daily.  Continue current medication regimen. Heart healthy diet and regular cardiovascular exercise encouraged. Care and ED precautions discussed.  If no improvement to her symptoms by next office visit, will consider Ranexa.  Tobacco use Smoking cessation encouraged and discussed.   3. PSVT Denies any tachycardia or palpitations.  No medication changes at this time.  Lifestyle modifications discussed. Heart healthy diet and regular cardiovascular exercise encouraged.   4.  Carotid artery disease Mild, 1 to 39% stenosis, along bilateral carotid arteries noted on recent carotid duplex.  Continue current medication regimen.  Smoking cessation discussed. Heart healthy diet and regular cardiovascular exercise encouraged.    Dispo: Follow-up with me/[facility] in 6-8 weeks or sooner if anything changes.   Signed, Laura Dory, NP

## 2023-06-27 NOTE — Patient Instructions (Signed)
 Medication Instructions:  Your physician recommends that you continue on your current medications as directed. Please refer to the Current Medication list given to you today.  Labwork: None   Testing/Procedures: None   Follow-Up: Your physician recommends that you schedule a follow-up appointment in: 3 months      Mediterranean Diet  Why follow it? Research shows. Those who follow the Mediterranean diet have a reduced risk of heart disease  The diet is associated with a reduced incidence of Parkinson's and Alzheimer's diseases People following the diet may have longer life expectancies and lower rates of chronic diseases  The Dietary Guidelines for Americans recommends the Mediterranean diet as an eating plan to promote health and prevent disease  What Is the Mediterranean Diet?  Healthy eating plan based on typical foods and recipes of Mediterranean-style cooking The diet is primarily a plant based diet; these foods should make up a majority of meals   Starches - Plant based foods should make up a majority of meals - They are an important sources of vitamins, minerals, energy, antioxidants, and fiber - Choose whole grains, foods high in fiber and minimally processed items  - Typical grain sources include wheat, oats, barley, corn, brown rice, bulgar, farro, millet, polenta, couscous  - Various types of beans include chickpeas, lentils, fava beans, black beans, white beans   Fruits  Veggies - Large quantities of antioxidant rich fruits & veggies; 6 or more servings  - Vegetables can be eaten raw or lightly drizzled with oil and cooked  - Vegetables common to the traditional Mediterranean Diet include: artichokes, arugula, beets, broccoli, brussel sprouts, cabbage, carrots, celery, collard greens, cucumbers, eggplant, kale, leeks, lemons, lettuce, mushrooms, okra, onions, peas, peppers, potatoes, pumpkin, radishes, rutabaga, shallots, spinach, sweet potatoes, turnips, zucchini - Fruits  common to the Mediterranean Diet include: apples, apricots, avocados, cherries, clementines, dates, figs, grapefruits, grapes, melons, nectarines, oranges, peaches, pears, pomegranates, strawberries, tangerines  Fats - Replace butter and margarine with healthy oils, such as olive oil, canola oil, and tahini  - Limit nuts to no more than a handful a day  - Nuts include walnuts, almonds, pecans, pistachios, pine nuts  - Limit or avoid candied, honey roasted or heavily salted nuts - Olives are central to the [employer] - can be eaten whole or used in a variety of dishes   Meats Protein - Limiting red meat: no more than a few times a month - When eating red meat: choose lean cuts and keep the portion to the size of deck of cards - Eggs: approx. 0 to 4 times a week  - Fish and lean poultry: at least 2 a week  - Healthy protein sources include, chicken, [place], lean beef, lamb - Increase intake of seafood such as tuna, salmon, trout, mackerel, shrimp, scallops - Avoid or limit high fat processed meats such as sausage and bacon  Dairy - Include moderate amounts of low fat dairy products  - Focus on healthy dairy such as fat free yogurt, skim milk, low or reduced fat cheese - Limit dairy products higher in fat such as whole or 2% milk, cheese, ice cream  Alcohol - Moderate amounts of red wine is ok  - No more than 5 oz daily for women (all ages) and men older than age 12  - No more than 10 oz of wine daily for men younger than 69  Other - Limit sweets and other desserts  - Use herbs and spices instead of salt to  flavor foods  - Herbs and spices common to the traditional Mediterranean Diet include: basil, bay leaves, chives, cloves, cumin, fennel, garlic, lavender, marjoram, mint, oregano, parsley, pepper, rosemary, sage, savory, sumac, tarragon, thyme   It's not just a diet, it's a lifestyle:  The Mediterranean diet includes lifestyle factors typical of those in the region  Foods, drinks and  meals are best eaten with others and savored Daily physical activity is important for overall good health This could be strenuous exercise like running and aerobics This could also be more leisurely activities such as walking, housework, yard-work, or taking the stairs Moderation is the key; a balanced and healthy diet accommodates most foods and drinks Consider portion sizes and frequency of consumption of certain foods   Meal Ideas & Options:  Breakfast:  Whole wheat toast or whole wheat English muffins with peanut butter & hard boiled egg Steel cut oats topped with apples & cinnamon and skim milk  Fresh fruit: banana, strawberries, melon, berries, peaches  Smoothies: strawberries, bananas, greek yogurt, peanut butter Low fat greek yogurt with blueberries and granola  Egg white omelet with spinach and mushrooms Breakfast couscous: whole wheat couscous, apricots, skim milk, cranberries  Sandwiches:  Hummus and grilled vegetables (peppers, zucchini, squash) on whole wheat bread   Grilled chicken on whole wheat pita with lettuce, tomatoes, cucumbers or tzatziki  [place] salad on whole wheat bread: tuna salad made with greek yogurt, olives, red peppers, capers, green onions Garlic rosemary lamb pita: lamb sauted with garlic, rosemary, salt & pepper; add lettuce, cucumber, greek yogurt to pita - flavor with lemon juice and black pepper  Seafood:  Mediterranean grilled salmon, seasoned with garlic, basil, parsley, lemon juice and black pepper Shrimp, lemon, and spinach whole-grain pasta salad made with low fat greek yogurt  Seared scallops with lemon orzo  Seared tuna steaks seasoned salt, pepper, coriander topped with tomato mixture of olives, tomatoes, olive oil, minced garlic, parsley, green onions and cappers  Meats:  Herbed greek chicken salad with kalamata olives, cucumber, feta  Red bell peppers stuffed with spinach, bulgur, lean ground beef (or lentils) & topped with feta   Kebabs:  skewers of chicken, tomatoes, onions, zucchini, squash  [place] burgers: made with red onions, mint, dill, lemon juice, feta cheese topped with roasted red peppers Vegetarian Cucumber salad: cucumbers, artichoke hearts, celery, red onion, feta cheese, tossed in olive oil & lemon juice  Hummus and whole grain pita points with a greek salad (lettuce, tomato, feta, olives, cucumbers, red onion) Lentil soup with celery, carrots made with vegetable broth, garlic, salt and pepper  Tabouli salad: parsley, bulgur, mint, scallions, cucumbers, tomato, radishes, lemon juice, olive oil, salt and pepper.  Any Other Special Instructions Will Be Listed Below (If Applicable).  If you need a refill on your cardiac medications before your next appointment, please call your pharmacy.

## 2023-07-15 IMAGING — CT CT HEAD W/O CM
3 series · 16 of 47 positions shown, 19 images · non-contrast
Comparison: None.

CLINICAL DATA: Headache, chronic, new features or increased
frequency

EXAM:
CT HEAD WITHOUT CONTRAST
TECHNIQUE: Contiguous axial images were obtained from the base of the skull
through the vertex without intravenous contrast.

[Series 2: head w o · axial · 0.48mm/px · z∈[+65,+215]mm · 10 of 36 slices shown, 13 images]
[im 3/36  brain]
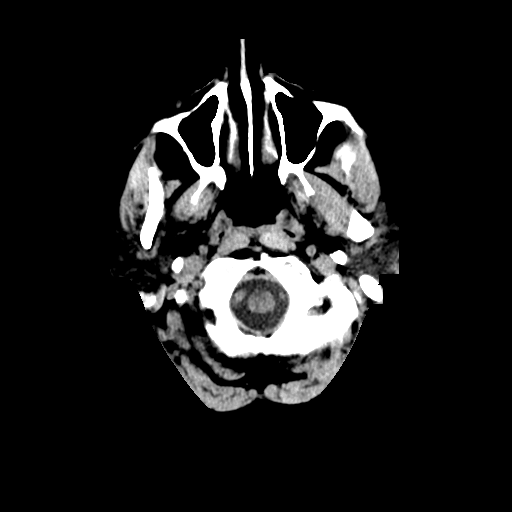
[im 3/36  bone]
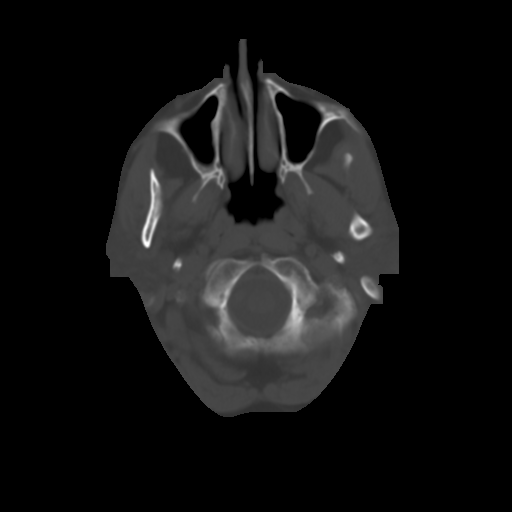
[im 7/36  brain]
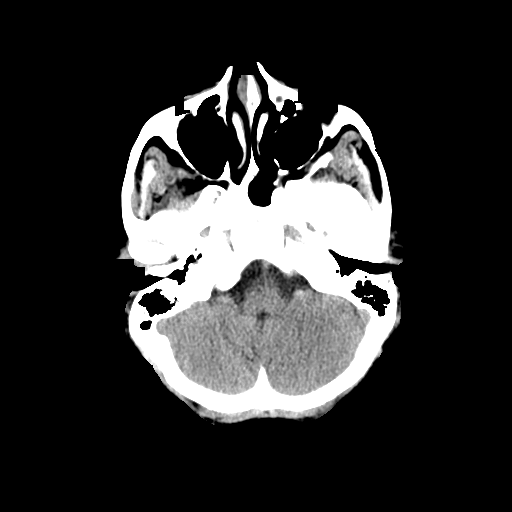
[im 10/36  brain]
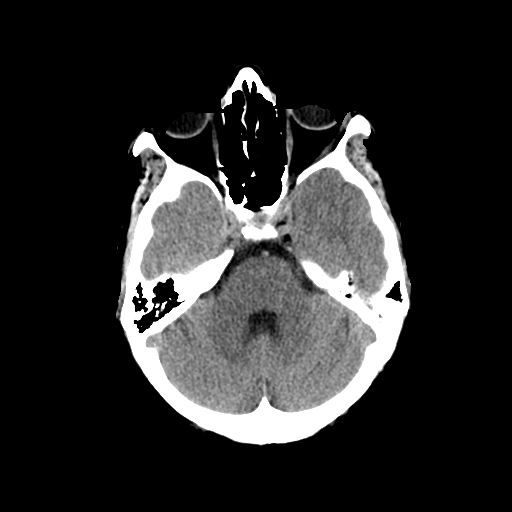
[im 13/36  brain]
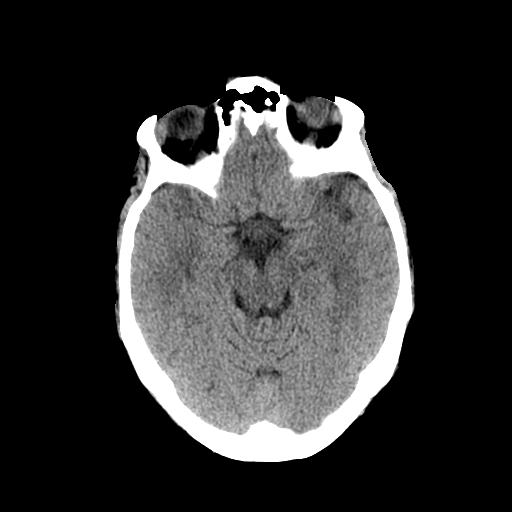
[im 16/36  brain]
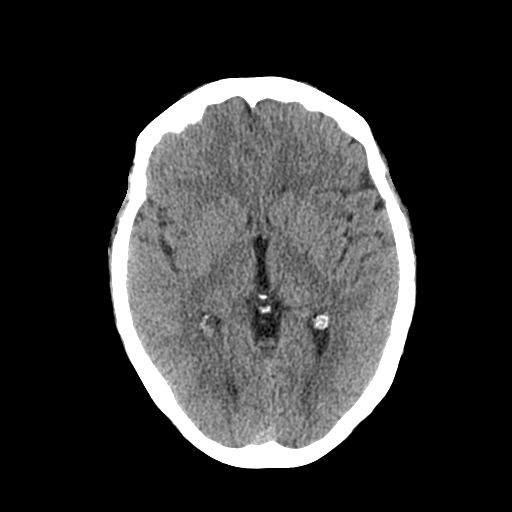
[im 16/36  bone]
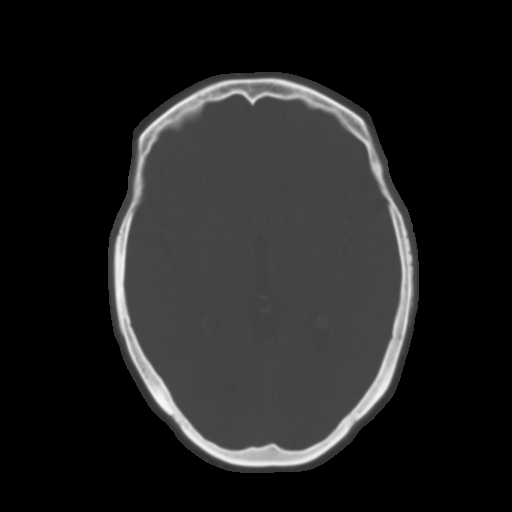
[im 20/36  brain]
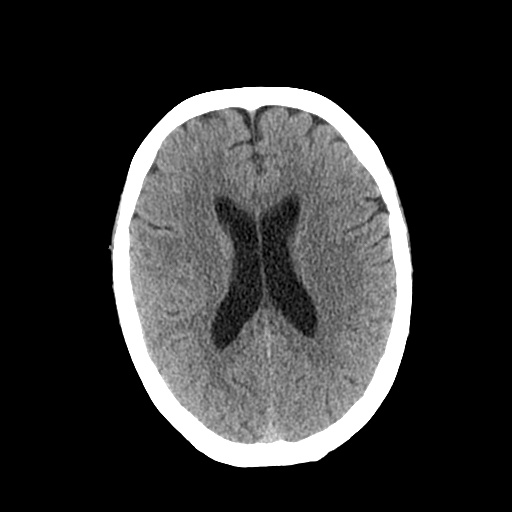
[im 23/36  brain]
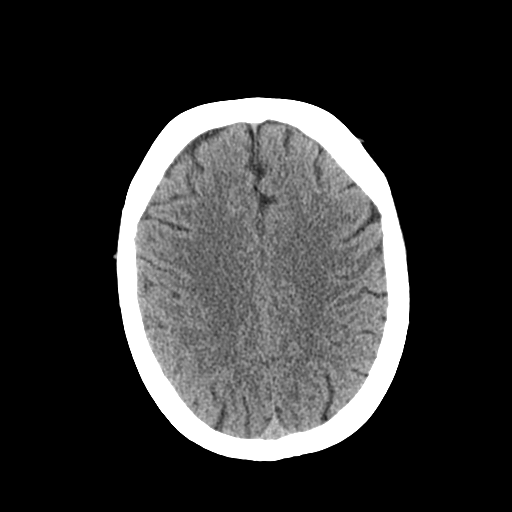
[im 27/36  brain]
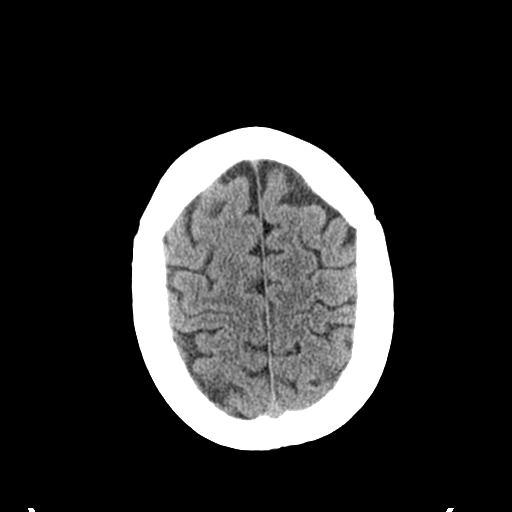
[im 29/36  brain]
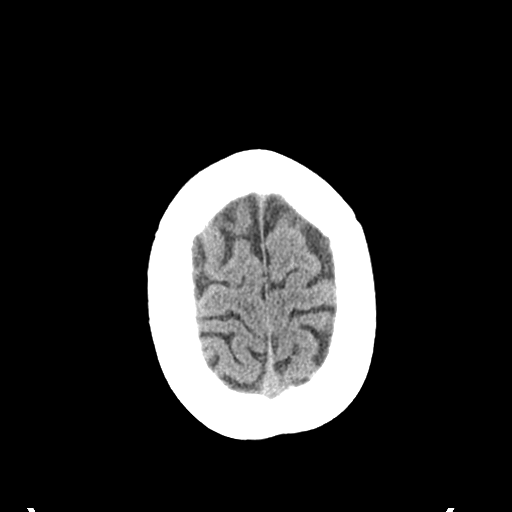
[im 29/36  bone]
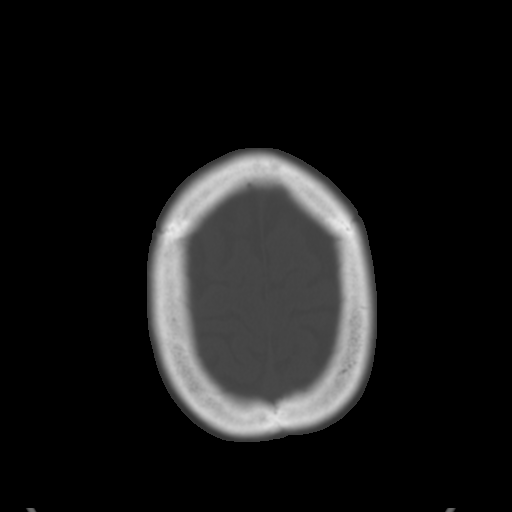
[im 33/36  brain]
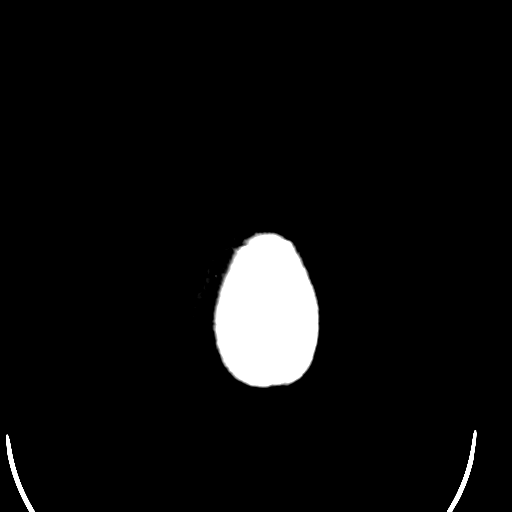

[Series 4: coronal soft · coronal · 0.33mm/px · 3 of 66 slices shown]
[im 22/66  brain]
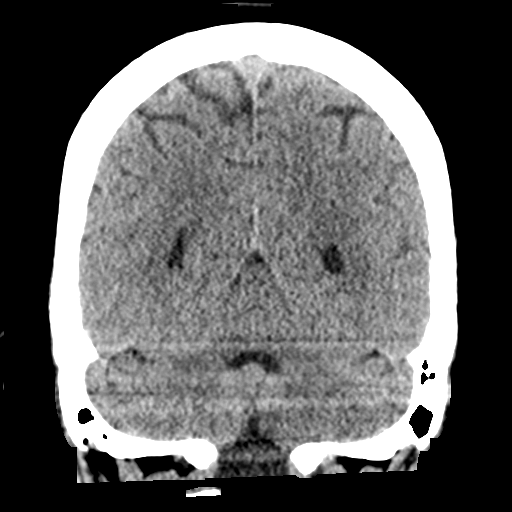
[im 29/66  brain]
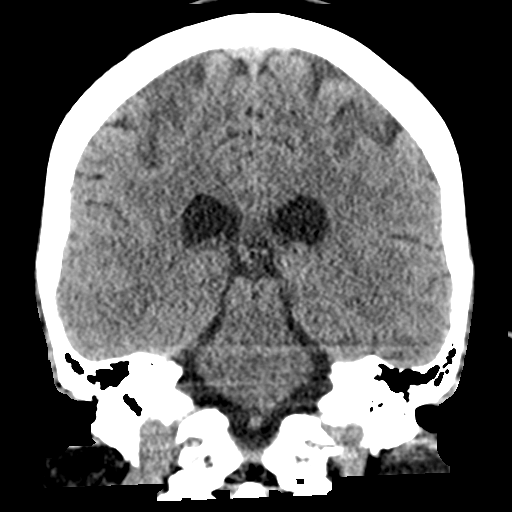
[im 37/66  brain]
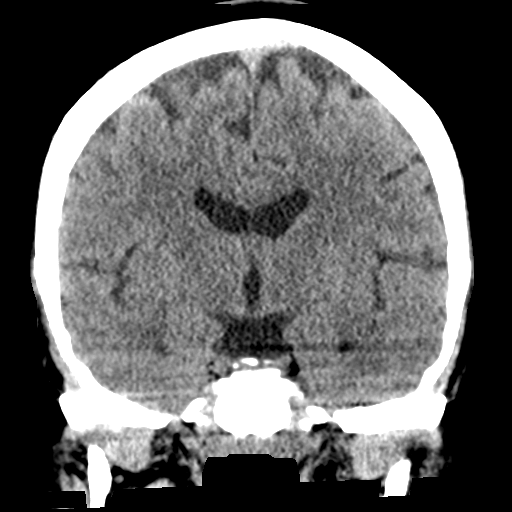

[Series 5: sagittal soft · sagittal · 0.33mm/px · 3 of 52 slices shown]
[im 18/52  brain]
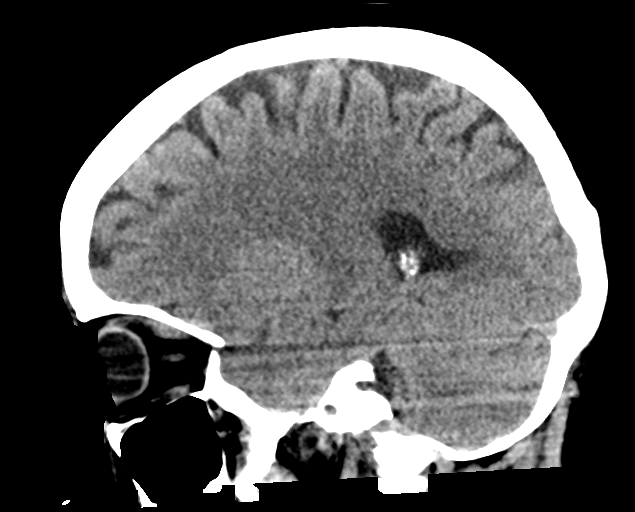
[im 26/52  brain]
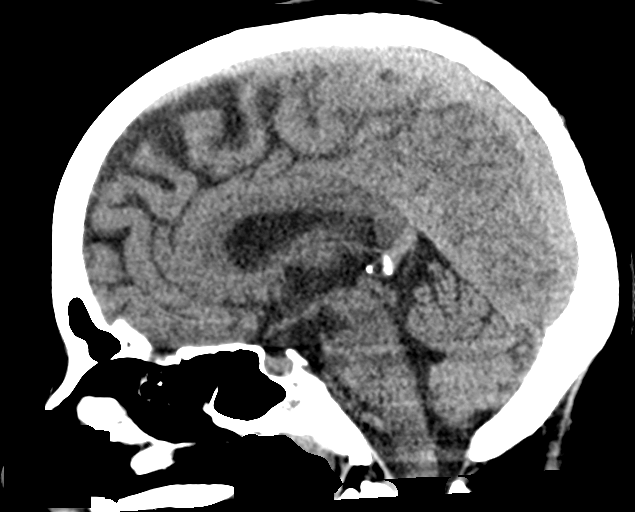
[im 35/52  brain]
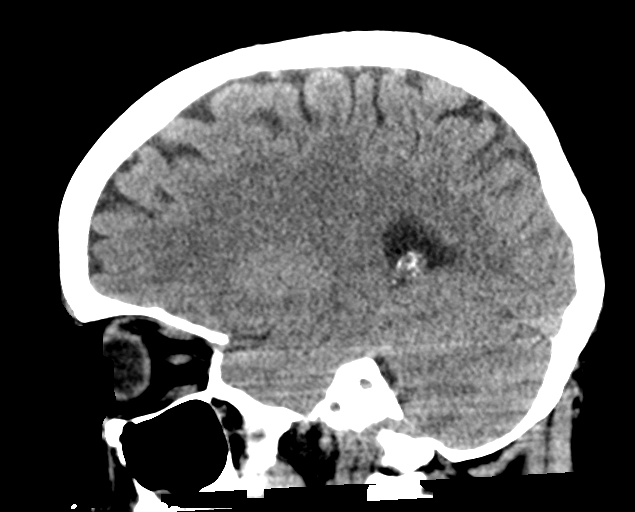

[16 of 47 positions shown; findings below may reference images not displayed]

FINDINGS: Brain: Normal anatomic configuration. No abnormal intra or
extra-axial mass lesion or fluid collection. No abnormal mass effect
or midline shift. No evidence of acute intracranial hemorrhage or
infarct. Ventricular size is normal. Cerebellum unremarkable.

Vascular: Unremarkable

Skull: Intact

Sinuses/Orbits: Paranasal sinuses are clear. Orbits are
unremarkable.

Other: Mastoid air cells and middle ear cavities are clear.
IMPRESSION: No acute intracranial abnormality.  Normal examination.

## 2023-07-19 ENCOUNTER — Ambulatory Visit: Payer: Self-pay | Admitting: Internal Medicine

## 2023-07-19 ENCOUNTER — Encounter: Payer: Self-pay | Admitting: Internal Medicine

## 2023-07-19 VITALS — BP 107/74 | HR 66 | Ht 69.0 in | Wt 159.4 lb

## 2023-07-19 DIAGNOSIS — F5104 Psychophysiologic insomnia: Secondary | ICD-10-CM | POA: Diagnosis not present

## 2023-07-19 DIAGNOSIS — Z1231 Encounter for screening mammogram for malignant neoplasm of breast: Secondary | ICD-10-CM

## 2023-07-19 DIAGNOSIS — Z720 Tobacco use: Secondary | ICD-10-CM | POA: Diagnosis not present

## 2023-07-19 DIAGNOSIS — Z1211 Encounter for screening for malignant neoplasm of colon: Secondary | ICD-10-CM

## 2023-07-19 DIAGNOSIS — Z23 Encounter for immunization: Secondary | ICD-10-CM

## 2023-07-19 NOTE — Patient Instructions (Signed)
 It was a pleasure to see you today.  Thank you for giving [place] the opportunity to be involved in your care.  Below is a brief recap of your visit and next steps.  We will plan to see you again in 6 months.  Summary You have established care today No medication changes were made Cologuard and Mammogram ordered I recommend trying melatonin for sleep Follow up in 6 months

## 2023-07-19 NOTE — Progress Notes (Signed)
 New Patient Office Visit  Subjective    Patient ID: Laura [name], female    DOB: 09-25-1965  Age: 58 y.o. MRN: [id number]  CC:  Chief Complaint  Patient presents with   Establish Care    Pt. States unable to get a full nights sleep, pt also says it may her nerves causing her not to sleep   HPI Laura [name] presents to establish care.  She is a 58 year old woman with a previously documented past medical history significant for PSVT, tobacco use, anxiety, and carotid artery disease.  She has not had a PCP recently and is not taking any medications currently.  Her acute concern today is insomnia attributed to increased stress.  She is interested in as needed medication for symptom relief.  She is otherwise asymptomatic.  Laura [name] currently works for [employer].  She endorses current tobacco use, smoking between 1.5-2 packs/day of cigarettes and has accumulated at least a 42-pack-year smoking history.  She denies alcohol or illicit drug use.  Family medical history is significant for early CAD (father had MI at age 52), lung cancer, dementia, gastric cancer, and bladder cancer.  Acute concerns, chronic medical conditions, and outstanding preventative care items discussed today are individually addressed in A/P below.  Outpatient Encounter Medications as of 07/19/2023  Medication Sig   aspirin  EC 81 MG tablet Take 81 mg by mouth daily. Swallow whole.   nitroGLYCERIN  (NITROSTAT ) 0.4 MG SL tablet Place 1 tablet (0.4 mg total) under the tongue every 5 (five) minutes as needed for chest pain.   No facility-administered encounter medications on file as of 07/19/2023.    Past Medical History:  Diagnosis Date   Anxiety    Benign breast cyst in female    Bronchitis 06/2013   Otitis media 06/2013   Pneumonia     Past Surgical History:  Procedure Laterality Date   ABDOMINAL HYSTERECTOMY N/A 05/18/2016   Procedure: HYSTERECTOMY ABDOMINAL;  Surgeon: Albino Hum, MD;  Location: [facility];   Service: Gynecology;  Laterality: N/A;   BILATERAL SALPINGECTOMY N/A 05/18/2016   Procedure: BILATERAL SALPINGECTOMY;  Surgeon: Albino Hum, MD;  Location: [facility];  Service: Gynecology;  Laterality: N/A;   BREAST SURGERY Right 2005   mass removed   CESAREAN SECTION  7829,5621   TUBAL LIGATION      Family History  Problem Relation Age of Onset   CAD Father    Lung cancer Paternal Grandfather        bone cancer   Heart disease Paternal Grandmother    Stomach cancer Maternal Grandmother    Tremor Brother    Parkinson's disease Brother    Other Daughter        breast mass   Asthma Son    Other Son        Lyme's    Social History   Socioeconomic History   Marital status: Divorced    Spouse name: Not on file   Number of children: Not on file   Years of education: Not on file   Highest education level: Not on file  Occupational History   Not on file  Tobacco Use   Smoking status: Every Day    Current packs/day: 1.00    Average packs/day: 1 pack/day for 30.0 years (30.0 ttl pk-yrs)    Types: Cigarettes   Smokeless tobacco: Never  Substance and Sexual Activity   Alcohol use: No   Drug use: No   Sexual activity: Yes  Birth control/protection: Surgical    Comment: tubal,hysterctomy  Other Topics Concern   Not on file  Social History Narrative   Not on file   Social Drivers of Health   Financial Resource Strain: Not on file  Food Insecurity: Low Risk  (08/27/2022)   Received from [facility]   Hunger Vital Sign    Worried About Running Out of Food in the Last Year: Never true    Ran Out of Food in the Last Year: Never true  Transportation Needs: Not on file (08/27/2022)  Physical Activity: Not on file  Stress: Not on file  Social Connections: Not on file  Intimate Partner Violence: Not on file   Review of Systems  Constitutional:  Negative for chills and fever.  HENT:  Negative for sore throat.   Respiratory:  Negative for cough and shortness of breath.    Cardiovascular:  Negative for chest pain, palpitations and leg swelling.  Gastrointestinal:  Negative for abdominal pain, blood in stool, constipation, diarrhea, nausea and vomiting.  Genitourinary:  Negative for dysuria and hematuria.  Musculoskeletal:  Negative for myalgias.  Skin:  Negative for itching and rash.  Neurological:  Negative for dizziness and headaches.  Psychiatric/Behavioral:  Negative for depression and suicidal ideas. The patient is nervous/anxious and has insomnia.    Objective    BP 107/74 (BP Location: Left Arm, Patient Position: Sitting, Cuff Size: Large)   Pulse 66   Ht 5\' 9"  (1.753 m)   Wt 159 lb 6.4 oz (72.3 kg)   LMP 05/11/2016 Comment: serum pregnancy negative 05/14/2016  SpO2 94%   BMI 23.54 kg/m   Physical Exam Vitals reviewed.  Constitutional:      General: She is not in acute distress.    Appearance: Normal appearance. She is not toxic-appearing.  HENT:     Head: Normocephalic and atraumatic.     Right Ear: External ear normal.     Left Ear: External ear normal.     Nose: Nose normal. No congestion or rhinorrhea.     Mouth/Throat:     Mouth: Mucous membranes are moist.     Pharynx: Oropharynx is clear. No oropharyngeal exudate or posterior oropharyngeal erythema.  Eyes:     General: No scleral icterus.    Extraocular Movements: Extraocular movements intact.     Conjunctiva/sclera: Conjunctivae normal.     Pupils: Pupils are equal, round, and reactive to light.  Cardiovascular:     Rate and Rhythm: Normal rate and regular rhythm.     Pulses: Normal pulses.     Heart sounds: Normal heart sounds. No murmur heard.    No friction rub. No gallop.  Pulmonary:     Effort: Pulmonary effort is normal.     Breath sounds: Normal breath sounds. No wheezing, rhonchi or rales.  Abdominal:     General: Abdomen is flat. Bowel sounds are normal. There is no distension.     Palpations: Abdomen is soft.     Tenderness: There is no abdominal tenderness.   Musculoskeletal:        General: No swelling. Normal range of motion.     Cervical back: Normal range of motion.     Right lower leg: No edema.     Left lower leg: No edema.  Lymphadenopathy:     Cervical: No cervical adenopathy.  Skin:    General: Skin is warm and dry.     Capillary Refill: Capillary refill takes less than 2 seconds.     Coloration:  Skin is not jaundiced.  Neurological:     General: No focal deficit present.     Mental Status: She is alert and oriented to person, place, and time.  Psychiatric:        Mood and Affect: Mood normal.        Behavior: Behavior normal.   Last CBC Lab Results  Component Value Date   WBC 7.6 05/19/2023   HGB 14.0 05/19/2023   HCT 43.4 05/19/2023   MCV 91.6 05/19/2023   MCH 29.5 05/19/2023   RDW 13.4 05/19/2023   PLT 247 05/19/2023   Last metabolic panel Lab Results  Component Value Date   GLUCOSE 95 05/19/2023   NA 140 05/19/2023   K 4.8 05/19/2023   CL 107 05/19/2023   CO2 26 05/19/2023   BUN 16 05/19/2023   CREATININE 0.66 05/19/2023   GFRNONAA >60 05/19/2023   CALCIUM 9.4 05/19/2023   PROT 6.6 05/19/2023   ALBUMIN 3.9 05/19/2023   BILITOT 0.2 05/19/2023   ALKPHOS 68 05/19/2023   AST 26 05/19/2023   ALT 13 05/19/2023   ANIONGAP 7 05/19/2023   Last lipids Lab Results  Component Value Date   CHOL 156 05/19/2023   HDL 33 (L) 05/19/2023   LDLCALC 92 05/19/2023   TRIG 156 (H) 05/19/2023   CHOLHDL 4.7 05/19/2023   Last thyroid  functions Lab Results  Component Value Date   TSH 1.870 05/19/2023   T4TOTAL 9.0 05/19/2023   Assessment & Plan:   Problem List Items Addressed This Visit       Tobacco use   She endorses current tobacco use, smoking between 1.5-2 packs/day of cigarettes and has accumulated at least a 42-pack-year history.  She is precontemplative with regards to cessation at this time.  No extracardiac findings in the chest noted on recent CT coronary calcium. -The patient was counseled on the  dangers of tobacco use, and was advised to quit and reluctant to quit.  Reviewed strategies to maximize success, including removing cigarettes and smoking materials from environment, stress management, substitution of other forms of reinforcement, support of family/friends, and written materials.  - Lung cancer screening will be due in January 2026      Colon cancer screening   Cologuard ordered today      Breast cancer screening by mammogram - Primary   Screening mammogram ordered today      Insomnia   She endorses recent insomnia attributed to anxiety.  Recommend trialing melatonin.      Need for shingles vaccine   Zoster vaccine #1 administered today      Return in about 6 months (around 01/19/2024).   Tobi Fortes, MD

## 2023-08-06 LAB — COLOGUARD: COLOGUARD: NEGATIVE

## 2023-08-08 ENCOUNTER — Ambulatory Visit (HOSPITAL_COMMUNITY)
Admission: RE | Admit: 2023-08-08 | Discharge: 2023-08-08 | Disposition: A | Discharge status: Home / Self Care | Payer: Self-pay | Source: Ambulatory Visit | Attending: Internal Medicine | Admitting: Internal Medicine

## 2023-08-08 ENCOUNTER — Encounter: Payer: Self-pay | Admitting: Internal Medicine

## 2023-08-08 DIAGNOSIS — Z1231 Encounter for screening mammogram for malignant neoplasm of breast: Secondary | ICD-10-CM | POA: Insufficient documentation

## 2023-08-20 DIAGNOSIS — G4700 Insomnia, unspecified: Secondary | ICD-10-CM | POA: Insufficient documentation

## 2023-08-20 DIAGNOSIS — Z1211 Encounter for screening for malignant neoplasm of colon: Secondary | ICD-10-CM | POA: Insufficient documentation

## 2023-08-20 DIAGNOSIS — Z23 Encounter for immunization: Secondary | ICD-10-CM | POA: Insufficient documentation

## 2023-08-20 DIAGNOSIS — Z1231 Encounter for screening mammogram for malignant neoplasm of breast: Secondary | ICD-10-CM | POA: Insufficient documentation

## 2023-08-20 NOTE — Assessment & Plan Note (Signed)
 Zoster vaccine #1 administered today

## 2023-08-20 NOTE — Assessment & Plan Note (Signed)
 She endorses current tobacco use, smoking between 1.5-2 packs/day of cigarettes and has accumulated at least a 42-pack-year history.  She is precontemplative with regards to cessation at this time.  No extracardiac findings in the chest noted on recent CT coronary calcium. -The patient was counseled on the dangers of tobacco use, and was advised to quit and reluctant to quit.  Reviewed strategies to maximize success, including removing cigarettes and smoking materials from environment, stress management, substitution of other forms of reinforcement, support of family/friends, and written materials.  - Lung cancer screening will be due in January 2026

## 2023-08-20 NOTE — Assessment & Plan Note (Signed)
 Screening mammogram ordered today

## 2023-08-20 NOTE — Assessment & Plan Note (Addendum)
 Cologuard ordered today

## 2023-08-20 NOTE — Assessment & Plan Note (Signed)
 She endorses recent insomnia attributed to anxiety.  Recommend trialing melatonin.

## 2023-09-27 ENCOUNTER — Ambulatory Visit: Payer: Self-pay | Attending: Nurse Practitioner | Admitting: Nurse Practitioner

## 2023-10-05 ENCOUNTER — Ambulatory Visit: Payer: Self-pay

## 2024-01-17 ENCOUNTER — Ambulatory Visit
